# Patient Record
Sex: Female | Born: 1985
Health system: Southern US, Community
[De-identification: ages and names within clinical notes are randomized; demographics above are authoritative.]

## PROBLEM LIST (undated history)

## (undated) DIAGNOSIS — O24419 Gestational diabetes mellitus in pregnancy, unspecified control: Secondary | ICD-10-CM

## (undated) DIAGNOSIS — O139 Gestational [pregnancy-induced] hypertension without significant proteinuria, unspecified trimester: Secondary | ICD-10-CM

## (undated) DIAGNOSIS — K8020 Calculus of gallbladder without cholecystitis without obstruction: Secondary | ICD-10-CM

## (undated) DIAGNOSIS — T8859XA Other complications of anesthesia, initial encounter: Secondary | ICD-10-CM

## (undated) DIAGNOSIS — B009 Herpesviral infection, unspecified: Secondary | ICD-10-CM

## (undated) HISTORY — PX: TONSILLECTOMY: SUR1361

---

## 2012-12-09 DIAGNOSIS — A6000 Herpesviral infection of urogenital system, unspecified: Secondary | ICD-10-CM | POA: Insufficient documentation

## 2015-01-30 DIAGNOSIS — G43909 Migraine, unspecified, not intractable, without status migrainosus: Secondary | ICD-10-CM | POA: Insufficient documentation

## 2018-06-09 ENCOUNTER — Emergency Department (HOSPITAL_COMMUNITY)
Admission: EM | Admit: 2018-06-09 | Discharge: 2018-06-09 | Disposition: A | Discharge status: Home / Self Care | Payer: BC Managed Care – PPO | Attending: Emergency Medicine | Admitting: Emergency Medicine

## 2018-06-09 ENCOUNTER — Encounter (HOSPITAL_COMMUNITY): Payer: Self-pay | Admitting: *Deleted

## 2018-06-09 ENCOUNTER — Other Ambulatory Visit: Payer: Self-pay

## 2018-06-09 DIAGNOSIS — M545 Low back pain, unspecified: Secondary | ICD-10-CM

## 2018-06-09 DIAGNOSIS — B9689 Other specified bacterial agents as the cause of diseases classified elsewhere: Secondary | ICD-10-CM | POA: Insufficient documentation

## 2018-06-09 DIAGNOSIS — N760 Acute vaginitis: Secondary | ICD-10-CM | POA: Diagnosis not present

## 2018-06-09 DIAGNOSIS — K5900 Constipation, unspecified: Secondary | ICD-10-CM | POA: Insufficient documentation

## 2018-06-09 DIAGNOSIS — R110 Nausea: Secondary | ICD-10-CM

## 2018-06-09 DIAGNOSIS — R1030 Lower abdominal pain, unspecified: Secondary | ICD-10-CM | POA: Diagnosis present

## 2018-06-09 LAB — PREGNANCY, URINE: Preg Test, Ur: NEGATIVE

## 2018-06-09 LAB — WET PREP, GENITAL
Sperm: NONE SEEN
Trich, Wet Prep: NONE SEEN
Yeast Wet Prep HPF POC: NONE SEEN

## 2018-06-09 LAB — CBC
HCT: 38.5 % (ref 36.0–46.0)
Hemoglobin: 11.3 g/dL — ABNORMAL LOW (ref 12.0–15.0)
MCH: 24.4 pg — ABNORMAL LOW (ref 26.0–34.0)
MCHC: 29.4 g/dL — ABNORMAL LOW (ref 30.0–36.0)
MCV: 83.2 fL (ref 80.0–100.0)
Platelets: 233 10*3/uL (ref 150–400)
RBC: 4.63 MIL/uL (ref 3.87–5.11)
RDW: 16.4 % — ABNORMAL HIGH (ref 11.5–15.5)
WBC: 7.6 10*3/uL (ref 4.0–10.5)
nRBC: 0 % (ref 0.0–0.2)

## 2018-06-09 LAB — URINALYSIS, ROUTINE W REFLEX MICROSCOPIC
Bilirubin Urine: NEGATIVE
Glucose, UA: NEGATIVE mg/dL
Hgb urine dipstick: NEGATIVE
Ketones, ur: NEGATIVE mg/dL
Leukocytes,Ua: NEGATIVE
Nitrite: NEGATIVE
Protein, ur: NEGATIVE mg/dL
Specific Gravity, Urine: 1.024 (ref 1.005–1.030)
pH: 5 (ref 5.0–8.0)

## 2018-06-09 LAB — COMPREHENSIVE METABOLIC PANEL
ALT: 11 U/L (ref 0–44)
AST: 13 U/L — ABNORMAL LOW (ref 15–41)
Albumin: 3.5 g/dL (ref 3.5–5.0)
Alkaline Phosphatase: 64 U/L (ref 38–126)
Anion gap: 5 (ref 5–15)
BUN: 12 mg/dL (ref 6–20)
CO2: 26 mmol/L (ref 22–32)
Calcium: 8.4 mg/dL — ABNORMAL LOW (ref 8.9–10.3)
Chloride: 106 mmol/L (ref 98–111)
Creatinine, Ser: 0.8 mg/dL (ref 0.44–1.00)
GFR calc Af Amer: >60 mL/min (ref >60)
GFR calc non Af Amer: >60 mL/min (ref >60)
Glucose, Bld: 107 mg/dL — ABNORMAL HIGH (ref 70–99)
Potassium: 3.8 mmol/L (ref 3.5–5.1)
Sodium: 137 mmol/L (ref 135–145)
Total Bilirubin: 0.6 mg/dL (ref 0.3–1.2)
Total Protein: 7.2 g/dL (ref 6.5–8.1)

## 2018-06-09 LAB — LIPASE, BLOOD: Lipase: 22 U/L (ref 11–51)

## 2018-06-09 MED ORDER — METRONIDAZOLE 500 MG PO TABS: 500.0000 mg | ORAL_TABLET | Freq: Two times a day (BID) | ORAL | 0 refills | Status: AC

## 2018-06-09 MED ORDER — FAMOTIDINE 20 MG PO TABS: 20.0000 mg | ORAL_TABLET | Freq: Every day | ORAL | 0 refills | Status: DC

## 2018-06-09 MED ORDER — POLYETHYLENE GLYCOL 3350 17 G PO PACK: 17.0000 g | PACK | Freq: Every day | ORAL | 0 refills | Status: DC

## 2018-06-09 NOTE — Discharge Instructions (Addendum)
Take Flagyl as prescribed.  Do not drink alcohol or taking this medication, as it can cause liver issues. Take pepcid daily to see if this helps with your nausea. Use MiraLAX daily until you are having regular bowel movements. It is important that you are staying well-hydrated with water.  Your urine should be clear to pale yellow. Use Tylenol and ibuprofen as needed for pain.  You may also try muscle patches/creams such as salonpas, icy hot, BenGay, or Biofreeze. Follow-up with your OB/GYN next week at your scheduled appointment.  You should discuss your pain and possible causes for this. If your symptoms not improving in 1 week with treatment with MiraLAX and famotidine, call the GI doctor listed below for further evaluation of your symptoms. Return to the emergency room if you develop high fevers, severe worsening abdominal pain, persistent, or any new, worsening, or concerning symptoms.

## 2018-06-09 NOTE — ED Provider Notes (Signed)
[facility] EMERGENCY DEPARTMENT Provider Note   CSN: [id number] Arrival date & time: 06/09/18  1329    History   Chief Complaint Chief Complaint  Patient presents with  . Back Pain    HPI Jackie Rojas is a 33 y.o. female presenting for evaluation of low back pain, lower abdominal pain, vaginal discharge, nausea.  Patient states for the past 2 months, she has been having gradually worsening symptoms.  She reports bilateral low back pain which radiates to her hips and her anterior abdomen.  Pain is been worse over the past 5 days.  Patient reports associated nausea.  Pain is worse with movement, and initially was worse with eating, although this is no longer true.  Patient states that since her pain became worse, about 5 days ago, she has had worsened constipation.  Patient states that she always has issues with needing to strain with a bowel movement, but is been worse these past couple days.  Her last bowel movement was today, but it was only little pellets.  She denies fevers, chills, chest pain, shortness of breath, vomiting, or abnormal urination.  Patient reports mild vaginal discharge which is baseline for her.  She is sexually active, but always uses protection.  Patient has an IUD for bleeding control and her periods.  Last period was 2 weeks ago, it was normal for her.    HPI  History reviewed. No pertinent past medical history.  There are no active problems to display for this patient.   Past Surgical History:  Procedure Laterality Date  . TONSILLECTOMY       OB History   No obstetric history on file.      Home Medications    Prior to Admission medications   Medication Sig Start Date End Date Taking? Authorizing Provider  famotidine (PEPCID) 20 MG tablet Take 1 tablet (20 mg total) by mouth daily. 06/09/18   Tyiesha Brackney, PA-C  metroNIDAZOLE (FLAGYL) 500 MG tablet Take 1 tablet (500 mg total) by mouth 2 (two) times daily for 7 days. 06/09/18 06/16/18   Charyl Minervini, PA-C  polyethylene glycol (MIRALAX / GLYCOLAX) packet Take 17 g by mouth daily. 06/09/18   Lakeeta Dobosz, PA-C    Family History No family history on file.  Social History Social History   Tobacco Use  . Smoking status: Never Smoker  . Smokeless tobacco: Never Used  Substance Use Topics  . Alcohol use: Yes    Comment: occasionally   . Drug use: Never     Allergies   Patient has no known allergies.   Review of Systems Review of Systems  Gastrointestinal: Positive for abdominal pain, constipation and nausea.  Genitourinary: Positive for vaginal discharge.  Musculoskeletal: Positive for back pain.  All other systems reviewed and are negative.    Physical Exam Updated Vital Signs BP (!) 132/92 (BP Location: Right Arm)   Pulse 80   Temp 97.9 F (36.6 C) (Oral)   Resp 17   Ht  (1.676 m)   Wt 125.2 kg   LMP 05/14/2018   SpO2 99%   BMI 44.55 kg/m   Physical Exam Vitals signs and nursing note reviewed. Exam conducted with a chaperone present.  Constitutional:      General: She is not in acute distress.    Appearance: She is well-developed.     Comments: Sitting comfortably in the bed in no acute distress  HENT:     Head: Normocephalic and atraumatic.  Eyes:  Conjunctiva/sclera: Conjunctivae normal.     Pupils: Pupils are equal, round, and reactive to light.  Neck:     Musculoskeletal: Normal range of motion and neck supple.  Cardiovascular:     Rate and Rhythm: Normal rate and regular rhythm.  Pulmonary:     Effort: Pulmonary effort is normal. No respiratory distress.     Breath sounds: Normal breath sounds. No wheezing.  Abdominal:     General: There is no distension.     Palpations: Abdomen is soft. There is no mass.     Tenderness: There is no abdominal tenderness. There is no guarding or rebound.     Comments: No tenderness palpation the abdomen.  Soft without rigidity, guarding, distention.  Negative rebound.    Genitourinary:    Exam position: Supine.     Cervix: Discharge present.     Uterus: Normal.      Adnexa: Right adnexa normal and left adnexa normal.     Comments: Thin white discharge noted on exam.  No CMT or adnexal tenderness. Musculoskeletal: Normal range of motion.     Comments: Mild discomfort with palpation of bilateral low back musculature.  No pain over midline spine.  Patient is ambulatory with out difficulty.  Skin:    General: Skin is warm and dry.  Neurological:     Mental Status: She is alert and oriented to person, place, and time.      ED Treatments / Results  Labs (all labs ordered are listed, but only abnormal results are displayed) Labs Reviewed  WET PREP, GENITAL - Abnormal; Notable for the following components:      Result Value   Clue Cells Wet Prep HPF POC PRESENT (*)    WBC, Wet Prep HPF POC FEW (*)    All other components within normal limits  COMPREHENSIVE METABOLIC PANEL - Abnormal; Notable for the following components:   Glucose, Bld 107 (*)    Calcium 8.4 (*)    AST 13 (*)    All other components within normal limits  CBC - Abnormal; Notable for the following components:   Hemoglobin 11.3 (*)    MCH 24.4 (*)    MCHC 29.4 (*)    RDW 16.4 (*)    All other components within normal limits  URINALYSIS, ROUTINE W REFLEX MICROSCOPIC - Abnormal; Notable for the following components:   APPearance HAZY (*)    All other components within normal limits  LIPASE, BLOOD  PREGNANCY, URINE  GC/CHLAMYDIA PROBE AMP ([place]) NOT AT [facility]    EKG None  Radiology No results found.  Procedures Procedures (including critical care time)  Medications Ordered in ED Medications - No data to display   Initial Impression / Assessment and Plan / ED Course  I have reviewed the triage vital signs and the nursing notes.  Pertinent labs & imaging results that were available during my care of the patient were reviewed by me and considered in my medical  decision making (see chart for details).        Pt presenting for evaluation of several month history of low back pain with radiation to her abdomen with associated nausea.  Patient also has associated constipation.  On exam today, patient is afebrile and appears nontoxic.  No abdominal tenderness.  Low suspicion for intra-abdominal infection, perforation, obstruction, or surgical abdomen.  Consider GU causes such as pelvic infection.  Consider constipation as cause.  Exam with thin white discharge, but no sign of PID, TOA, or torsion.  Will obtain labs, urine and reassess.  Labs reassuring, no leukocytosis.  Kidney, liver, pancreatic function reassuring.  Patient with mild anemia, this is improved from previous.  Urine without infection.  Wet prep positive for clue cells.  While I doubt BV as the cause of her symptoms, will treat as she is having symptoms.  Discussed findings and plan with patient.  Discussed treatment for constipation with MiraLAX and improved hydration.  Discussed course of Pepcid for persistent nausea.  Encourage patient to follow-up with GI if symptoms are not improving.  Patient already has an appoint with her OB/GYN next week, will follow-up to see if IUD could be causing her pain.  At this time, patient appears safe for discharge.  Return precautions given.  Patient states she understands and agrees to plan.   Final Clinical Impressions(s) / ED Diagnoses   Final diagnoses:  BV (bacterial vaginosis)  Acute bilateral low back pain without sciatica  Nausea  Constipation, unspecified constipation type    ED Discharge Orders         Ordered    famotidine (PEPCID) 20 MG tablet  Daily     06/09/18 1604    polyethylene glycol (MIRALAX / GLYCOLAX) packet  Daily     06/09/18 1604    metroNIDAZOLE (FLAGYL) 500 MG tablet  2 times daily     06/09/18 1604           Emmerich Cryer, PA-C 06/09/18 1619    Terrilee Files, MD 06/10/18 1045

## 2018-06-09 NOTE — ED Triage Notes (Signed)
Pt c/o lower back pain that radiates around both sides into lower abdomen and nausea x months, worsening over the past 4-5 days. Denies dysuria. Pt reports [facility] vaginal discharge but reports it is no different than normal. Pt reports hx of several vaginal infections.

## 2018-06-12 LAB — GC/CHLAMYDIA PROBE AMP (~~LOC~~) NOT AT ARMC
Chlamydia: NEGATIVE
Neisseria Gonorrhea: NEGATIVE

## 2019-01-30 ENCOUNTER — Telehealth: Payer: BC Managed Care – PPO

## 2019-01-30 ENCOUNTER — Ambulatory Visit (INDEPENDENT_AMBULATORY_CARE_PROVIDER_SITE_OTHER)
Admission: RE | Admit: 2019-01-30 | Discharge: 2019-01-30 | Disposition: A | Discharge status: Home / Self Care | Payer: BC Managed Care – PPO | Source: Ambulatory Visit

## 2019-01-30 ENCOUNTER — Other Ambulatory Visit: Payer: Self-pay

## 2019-01-30 DIAGNOSIS — J0100 Acute maxillary sinusitis, unspecified: Secondary | ICD-10-CM

## 2019-01-30 MED ORDER — FLUCONAZOLE 150 MG PO TABS: 150.0000 mg | ORAL_TABLET | Freq: Every day | ORAL | 0 refills | Status: DC

## 2019-01-30 MED ORDER — AMOXICILLIN-POT CLAVULANATE 875-125 MG PO TABS: 1.0000 | ORAL_TABLET | Freq: Two times a day (BID) | ORAL | 0 refills | Status: AC

## 2019-01-30 NOTE — ED Provider Notes (Signed)
Virtual Visit via Video Note:  Jackie Rojas  initiated request for Telemedicine visit with [facility] Urgent [facility] team. I connected with Jackie Rojas  on 01/30/2019 at 4:15 PM  for a synchronized telemedicine visit using a video enabled HIPPA compliant telemedicine application. I verified that I am speaking with Jackie Rojas  using two identifiers. Jackie Balloon, NP  was physically located in a [facility] Urgent care site and Ligaya Cormier was located at a different location.   The limitations of evaluation and management by telemedicine as well as the availability of in-person appointments were discussed. Patient was informed that she  may incur a bill ( including co-pay) for this virtual visit encounter. Jackie Rojas  expressed understanding and gave verbal consent to proceed with virtual visit.     History of Present Illness:Jackie Rojas  is a 33 y.o. female presents for evaluation of sinus pressure and right maxillary sinus tenderness x 6 days.  She states the pain has progressed now to causing her right ear to ache.  She denies fever, chills, sore throat, cough, shortness of breath, or other symptoms.  Treatment attempted at home with Vicks sinus and cold which has not helped.   LMP: 01/21/2019.    No Known Allergies   History reviewed. No pertinent past medical history.   Social History   Tobacco Use  . Smoking status: Never Smoker  . Smokeless tobacco: Never Used  Substance Use Topics  . Alcohol use: Yes    Comment: occasionally   . Drug use: Never        Observations/Objective: Physical Exam  VITALS: Patient denies fever. GENERAL: Alert, appears well and in no acute distress. HEENT: Atraumatic. NECK: Normal movements of the head and neck. CARDIOPULMONARY: No increased WOB. Speaking in clear sentences. I:E ratio WNL.  MS: Moves all visible extremities without noticeable abnormality. PSYCH: Pleasant and cooperative, well-groomed. Speech normal rate and  rhythm. Affect is appropriate. Insight and judgement are appropriate. Attention is focused, linear, and appropriate.  NEURO: CN grossly intact. Oriented as arrived to appointment on time with no prompting. Moves both UE equally.   Assessment and Plan:    ICD-10-CM   1. Acute non-recurrent maxillary sinusitis  J01.00        Follow Up Instructions: Treating with Augmentin and Diflucan (patient states she gets yeast infections when she takes an antibiotic).  Instructed patient to OTC Mucinex.  Discussed that she should be seen here in person or by her PCP if her symptoms or not improving.  Patient agrees to plan of care.    I discussed the assessment and treatment plan with the patient. The patient was provided an opportunity to ask questions and all were answered. The patient agreed with the plan and demonstrated an understanding of the instructions.   The patient was advised to call back or seek an in-person evaluation if the symptoms worsen or if the condition fails to improve as anticipated.      Jackie Balloon, NP  01/30/2019 4:15 PM         Jackie Balloon, NP 01/30/19 1616

## 2019-01-30 NOTE — Discharge Instructions (Addendum)
Take the Augmentin and Diflucan as directed.    Follow up with your primary care provider or come here to be seen in person if your symptoms are not improving.

## 2019-02-04 HISTORY — PX: CHOLECYSTECTOMY: SHX55

## 2019-06-25 ENCOUNTER — Other Ambulatory Visit: Payer: Self-pay

## 2019-06-25 ENCOUNTER — Ambulatory Visit: Payer: BC Managed Care – PPO | Attending: Internal Medicine

## 2019-06-25 DIAGNOSIS — Z20822 Contact with and (suspected) exposure to covid-19: Secondary | ICD-10-CM

## 2019-06-26 ENCOUNTER — Telehealth: Payer: Self-pay

## 2019-06-26 LAB — NOVEL CORONAVIRUS, NAA: SARS-CoV-2, NAA: NOT DETECTED

## 2019-06-26 LAB — SARS-COV-2, NAA 2 DAY TAT

## 2019-06-26 NOTE — Telephone Encounter (Signed)
Patient called and she was informed that her test for COVID-19 06/25/19 was still pending result.  She verbalized understanding and will call back.

## 2019-07-26 ENCOUNTER — Encounter (HOSPITAL_COMMUNITY): Payer: Self-pay

## 2019-07-26 ENCOUNTER — Emergency Department (HOSPITAL_COMMUNITY)
Admission: EM | Admit: 2019-07-26 | Discharge: 2019-07-26 | Disposition: A | Discharge status: Home / Self Care | Payer: BC Managed Care – PPO | Attending: Emergency Medicine | Admitting: Emergency Medicine

## 2019-07-26 ENCOUNTER — Other Ambulatory Visit: Payer: Self-pay

## 2019-07-26 DIAGNOSIS — R55 Syncope and collapse: Secondary | ICD-10-CM

## 2019-07-26 DIAGNOSIS — Z79899 Other long term (current) drug therapy: Secondary | ICD-10-CM | POA: Insufficient documentation

## 2019-07-26 DIAGNOSIS — R519 Headache, unspecified: Secondary | ICD-10-CM

## 2019-07-26 HISTORY — DX: Gestational (pregnancy-induced) hypertension without significant proteinuria, unspecified trimester: O13.9

## 2019-07-26 LAB — URINALYSIS, ROUTINE W REFLEX MICROSCOPIC
Bilirubin Urine: NEGATIVE
Glucose, UA: NEGATIVE mg/dL
Hgb urine dipstick: NEGATIVE
Ketones, ur: NEGATIVE mg/dL
Leukocytes,Ua: NEGATIVE
Nitrite: NEGATIVE
Protein, ur: NEGATIVE mg/dL
Specific Gravity, Urine: 1.027 (ref 1.005–1.030)
pH: 6 (ref 5.0–8.0)

## 2019-07-26 LAB — BASIC METABOLIC PANEL
Anion gap: 8 (ref 5–15)
BUN: 14 mg/dL (ref 6–20)
CO2: 25 mmol/L (ref 22–32)
Calcium: 8.4 mg/dL — ABNORMAL LOW (ref 8.9–10.3)
Chloride: 103 mmol/L (ref 98–111)
Creatinine, Ser: 0.83 mg/dL (ref 0.44–1.00)
GFR calc Af Amer: >60 mL/min (ref >60)
GFR calc non Af Amer: >60 mL/min (ref >60)
Glucose, Bld: 101 mg/dL — ABNORMAL HIGH (ref 70–99)
Potassium: 4.1 mmol/L (ref 3.5–5.1)
Sodium: 136 mmol/L (ref 135–145)

## 2019-07-26 LAB — CBC WITH DIFFERENTIAL/PLATELET
Abs Immature Granulocytes: 0.02 10*3/uL (ref 0.00–0.07)
Basophils Absolute: 0 10*3/uL (ref 0.0–0.1)
Basophils Relative: 0 %
Eosinophils Absolute: 0 10*3/uL (ref 0.0–0.5)
Eosinophils Relative: 0 %
HCT: 37.7 % (ref 36.0–46.0)
Hemoglobin: 11.4 g/dL — ABNORMAL LOW (ref 12.0–15.0)
Immature Granulocytes: 0 %
Lymphocytes Relative: 33 %
Lymphs Abs: 2.5 10*3/uL (ref 0.7–4.0)
MCH: 25.2 pg — ABNORMAL LOW (ref 26.0–34.0)
MCHC: 30.2 g/dL (ref 30.0–36.0)
MCV: 83.4 fL (ref 80.0–100.0)
Monocytes Absolute: 0.6 10*3/uL (ref 0.1–1.0)
Monocytes Relative: 8 %
Neutro Abs: 4.5 10*3/uL (ref 1.7–7.7)
Neutrophils Relative %: 59 %
Platelets: 281 10*3/uL (ref 150–400)
RBC: 4.52 MIL/uL (ref 3.87–5.11)
RDW: 15.9 % — ABNORMAL HIGH (ref 11.5–15.5)
WBC: 7.6 10*3/uL (ref 4.0–10.5)
nRBC: 0 % (ref 0.0–0.2)

## 2019-07-26 LAB — TROPONIN I (HIGH SENSITIVITY)
Troponin I (High Sensitivity): <2 ng/L (ref <18)
Troponin I (High Sensitivity): <2 ng/L (ref <18)

## 2019-07-26 LAB — PREGNANCY, URINE: Preg Test, Ur: NEGATIVE

## 2019-07-26 MED ORDER — ACETAMINOPHEN 325 MG PO TABS
650.0000 mg | ORAL_TABLET | Freq: Once | ORAL | Status: AC
  Administered 2019-07-26: 650 mg via ORAL
  Filled 2019-07-26: qty 2

## 2019-07-26 NOTE — Discharge Instructions (Signed)
As discussed, follow-up with your primary doctor for recheck.  Return to ER for any worsening symptoms

## 2019-07-26 NOTE — ED Triage Notes (Signed)
Pt presents with complaints of headache, nausea, dizziness and acid reflux.   Reports BP at work systolic was 205/  Pt reports Excedrin migraine PTA and no longer has headache.

## 2019-07-26 NOTE — ED Provider Notes (Signed)
[facility] EMERGENCY DEPARTMENT Provider Note   CSN: [id number] Arrival date & time: 07/26/19  1219     History Chief Complaint  Patient presents with  . Headache    Jackie Rojas is a 34 y.o. female.  HPI      Jackie Rojas is a 34 y.o. female who presents to the Emergency Department for evaluation of possible hypertension.  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  She reports intermittent headaches for weeks to months.  She describes the headaches as throbbing pounding sensations from her forehead to the top of her head.  Headaches typically improve after over-the-counter pain relievers.  She had a headache earlier this morning that was gradual in onset and similar to previous headaches and improved after Excedrin migraine. 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  She states that she had a systolic blood pressure greater than 200 and was recommended by co workers that she needed medical evaluation. No actual syncope, visual change, neck pain or stiffness, fever or chills, numbness or weakness of face or extremities.  She denies recent illness.  She also states that 2 weeks ago she took a morning-after pill and had a irregular menstrual cycle shortly after.  Concerned she may be pregnant,  She denies any pelvic or abdominal pain, no vaginal bleeding at present.  She admits to a significant family history for diabetes but has never been diagnosed herself.   Past Medical History:  Diagnosis Date  . Gestational hypertension     There are no problems to display for this patient.   Past Surgical History:  Procedure Laterality Date  . TONSILLECTOMY       OB History   No obstetric history on file.     History reviewed. No pertinent family history.  Social History   Tobacco Use  . Smoking status: Never  Smoker  . Smokeless tobacco: Never Used  Substance Use Topics  . Alcohol use: Yes    Comment: occasionally   . Drug use: Never    Home Medications Prior to Admission medications   Medication Sig Start Date End Date Taking? Authorizing Provider  aspirin-acetaminophen-caffeine (EXCEDRIN MIGRAINE) [phone] MG tablet Take 2 tablets by mouth every 6 (six) hours as needed.    Yes [provider]  fluticasone (FLONASE) 50 MCG/ACT nasal spray Place 1 spray into both nostrils daily as needed for allergies or rhinitis.   Yes [provider]  ibuprofen (ADVIL) 200 MG tablet Take 800 mg by mouth every 6 (six) hours as needed.   Yes [provider]  SUMAtriptan (IMITREX) 100 MG tablet Take 1 tablet by mouth 2 (two) times daily as needed. 12/30/16  Yes [provider]  valACYclovir (VALTREX) 500 MG tablet Take 2,000 mg by mouth daily as needed.   Yes [provider]    Allergies    Patient has no known allergies.  Review of Systems   Review of Systems  Constitutional: Negative for appetite change, chills, fatigue and fever.  HENT: Negative for rhinorrhea, sinus pressure, sinus pain and trouble swallowing.   Eyes: Negative for visual disturbance.  Respiratory: Negative for cough, chest tightness and shortness of breath.   Cardiovascular: Negative for chest pain and palpitations.  Gastrointestinal: Positive for nausea. Negative for abdominal pain, blood in stool and vomiting.  Genitourinary: Positive  for menstrual problem. Negative for dysuria, flank pain, hematuria and vaginal discharge.  Musculoskeletal: Negative for arthralgias, back pain, myalgias, neck pain and neck stiffness.  Skin: Negative for rash.  Neurological: Positive for syncope (near syncope) and headaches. Negative for dizziness, facial asymmetry, speech difficulty, weakness and numbness.  Hematological: Does not bruise/bleed easily.  Psychiatric/Behavioral: Negative for confusion.     Physical Exam Updated Vital Signs BP 127/85   Pulse 81   Temp 98 F (36.7 C) (Oral)   Resp 11   Ht 5\' 6"  (1.676 m)   Wt 127 kg   LMP 07/18/2019   SpO2 100%   BMI 45.19 kg/m   Physical Exam Vitals and nursing note reviewed.  Constitutional:      General: She is not in acute distress.    Appearance: She is well-developed. She is obese. She is not ill-appearing.  HENT:     Head: Normocephalic.     Mouth/Throat:     Mouth: Mucous membranes are moist.  Eyes:     Extraocular Movements: Extraocular movements intact.     Right eye: Normal extraocular motion and no nystagmus.     Left eye: Normal extraocular motion and no nystagmus.     Pupils: Pupils are equal, round, and reactive to light.  Neck:     Meningeal: Kernig's sign absent.  Cardiovascular:     Rate and Rhythm: Normal rate and regular rhythm.  Pulmonary:     Effort: Pulmonary effort is normal.     Breath sounds: Normal breath sounds.  Abdominal:     Palpations: Abdomen is soft.     Tenderness: There is no abdominal tenderness.  Musculoskeletal:        General: No swelling. Normal range of motion.     Cervical back: Normal range of motion. No rigidity.  Skin:    General: Skin is warm.     Capillary Refill: Capillary refill takes less than 2 seconds.     Findings: No rash.  Neurological:     Mental Status: She is alert.     GCS: GCS eye subscore is 4. GCS verbal subscore is 5. GCS motor subscore is 6.     Sensory: Sensation is intact.     Motor: Motor function is intact. No weakness.     Coordination: Coordination is intact.     Gait: Gait normal.     Comments: CN II-XII intact.  Speech clear.  No pronator drift.  nml finger nose testing. No antalgic gait.   Psychiatric:        Mood and Affect: Mood normal.     ED Results / Procedures / Treatments   Labs (all labs ordered are listed, but only abnormal results are displayed) Labs Reviewed  BASIC METABOLIC PANEL - Abnormal; Notable for the following  components:      Result Value   Glucose, Bld 101 (*)    Calcium 8.4 (*)    All other components within normal limits  CBC WITH DIFFERENTIAL/PLATELET - Abnormal; Notable for the following components:   Hemoglobin 11.4 (*)    MCH 25.2 (*)    RDW 15.9 (*)    All other components within normal limits  URINALYSIS, ROUTINE W REFLEX MICROSCOPIC  PREGNANCY, URINE  TROPONIN I (HIGH SENSITIVITY)  TROPONIN I (HIGH SENSITIVITY)    EKG EKG Interpretation  Date/Time:  Thursday July 26 2019 12:54:14 EDT Ventricular Rate:  67 PR Interval:    QRS Duration: 90 QT Interval:  399 QTC Calculation: 422 R  Axis:   38 Text Interpretation: Sinus rhythm Low voltage, precordial leads Abnormal R-wave progression, early transition Borderline T abnormalities, inferior leads Baseline wander in lead(s) I III aVL V6 No STEMI Confirmed by Alona Bene [phone]) on 07/26/2019 1:06:26 PM   Radiology No results found.  Procedures Procedures (including critical care time)  Medications Ordered in ED Medications  acetaminophen (TYLENOL) tablet 650 mg (650 mg Oral Given 07/26/19 1442)    ED Course  I have reviewed the triage vital signs and the nursing notes.  Pertinent labs & imaging results that were available during my care of the patient were reviewed by me and considered in my medical decision making (see chart for details).    MDM Rules/Calculators/A&P                      Orthostatic VS for the past 24 hrs (Last 3 readings):  BP- Lying Pulse- Lying BP- Sitting Pulse- Sitting BP- Standing at 0 minutes Pulse- Standing at 0 minutes  07/26/19 1345 112/49 59 154/80 64 110/67 74    Pt appears well and non-toxic.  She is not hypertensive here.  Headache resolved prior to arrival.  No focal neuro deficits, visual changes.  No nuchal rigidity.  Headache was gradual in onset and had resolved prior to near syncopal episode. No indication for emergent CT imaging of head.  Episode occurred after exertion.  No  longer feeling faint.   No chest pain or dyspnea.  PERC neg.    Work up reassuring.  Pt reports feeling better after evaluation, has tolerated po fluids and ate a snack.  I feel she is appropriate for d/c home, return precautions discussed.    Final Clinical Impression(s) / ED Diagnoses Final diagnoses:  Near syncope  Headache disorder    Rx / DC Orders ED Discharge Orders    None       Rosey Bath 07/27/19 2010    Maia Plan, MD 07/30/19 1358

## 2019-08-28 ENCOUNTER — Inpatient Hospital Stay (HOSPITAL_COMMUNITY): Payer: BC Managed Care – PPO

## 2019-08-28 ENCOUNTER — Other Ambulatory Visit: Payer: Self-pay

## 2019-08-28 ENCOUNTER — Encounter (HOSPITAL_COMMUNITY): Payer: Self-pay | Admitting: Obstetrics and Gynecology

## 2019-08-28 ENCOUNTER — Inpatient Hospital Stay (HOSPITAL_COMMUNITY)
Admission: AD | Admit: 2019-08-28 | Discharge: 2019-08-28 | Disposition: A | Discharge status: Home / Self Care | Payer: BC Managed Care – PPO | Attending: Obstetrics and Gynecology | Admitting: Obstetrics and Gynecology

## 2019-08-28 DIAGNOSIS — O99891 Other specified diseases and conditions complicating pregnancy: Secondary | ICD-10-CM | POA: Diagnosis not present

## 2019-08-28 DIAGNOSIS — R109 Unspecified abdominal pain: Secondary | ICD-10-CM | POA: Insufficient documentation

## 2019-08-28 DIAGNOSIS — M545 Low back pain: Secondary | ICD-10-CM | POA: Insufficient documentation

## 2019-08-28 DIAGNOSIS — Z3A01 Less than 8 weeks gestation of pregnancy: Secondary | ICD-10-CM

## 2019-08-28 DIAGNOSIS — O26899 Other specified pregnancy related conditions, unspecified trimester: Secondary | ICD-10-CM

## 2019-08-28 DIAGNOSIS — O3680X0 Pregnancy with inconclusive fetal viability, not applicable or unspecified: Secondary | ICD-10-CM | POA: Diagnosis not present

## 2019-08-28 DIAGNOSIS — O26891 Other specified pregnancy related conditions, first trimester: Secondary | ICD-10-CM | POA: Diagnosis not present

## 2019-08-28 HISTORY — DX: Herpesviral infection, unspecified: B00.9

## 2019-08-28 HISTORY — DX: Calculus of gallbladder without cholecystitis without obstruction: K80.20

## 2019-08-28 LAB — CBC
HCT: 39.2 % (ref 36.0–46.0)
Hemoglobin: 12.1 g/dL (ref 12.0–15.0)
MCH: 25.4 pg — ABNORMAL LOW (ref 26.0–34.0)
MCHC: 30.9 g/dL (ref 30.0–36.0)
MCV: 82.2 fL (ref 80.0–100.0)
Platelets: 271 10*3/uL (ref 150–400)
RBC: 4.77 MIL/uL (ref 3.87–5.11)
RDW: 16.5 % — ABNORMAL HIGH (ref 11.5–15.5)
WBC: 7.1 10*3/uL (ref 4.0–10.5)
nRBC: 0 % (ref 0.0–0.2)

## 2019-08-28 LAB — COMPREHENSIVE METABOLIC PANEL
ALT: 12 U/L (ref 0–44)
AST: 13 U/L — ABNORMAL LOW (ref 15–41)
Albumin: 3.3 g/dL — ABNORMAL LOW (ref 3.5–5.0)
Alkaline Phosphatase: 59 U/L (ref 38–126)
Anion gap: 10 (ref 5–15)
BUN: 10 mg/dL (ref 6–20)
CO2: 23 mmol/L (ref 22–32)
Calcium: 8.9 mg/dL (ref 8.9–10.3)
Chloride: 104 mmol/L (ref 98–111)
Creatinine, Ser: 0.81 mg/dL (ref 0.44–1.00)
GFR calc Af Amer: >60 mL/min (ref >60)
GFR calc non Af Amer: >60 mL/min (ref >60)
Glucose, Bld: 99 mg/dL (ref 70–99)
Potassium: 4.3 mmol/L (ref 3.5–5.1)
Sodium: 137 mmol/L (ref 135–145)
Total Bilirubin: 0.5 mg/dL (ref 0.3–1.2)
Total Protein: 7.4 g/dL (ref 6.5–8.1)

## 2019-08-28 LAB — URINALYSIS, ROUTINE W REFLEX MICROSCOPIC
Bilirubin Urine: NEGATIVE
Glucose, UA: NEGATIVE mg/dL
Hgb urine dipstick: NEGATIVE
Ketones, ur: NEGATIVE mg/dL
Leukocytes,Ua: NEGATIVE
Nitrite: NEGATIVE
Protein, ur: NEGATIVE mg/dL
Specific Gravity, Urine: 1.024 (ref 1.005–1.030)
pH: 6 (ref 5.0–8.0)

## 2019-08-28 LAB — ABO/RH: ABO/RH(D): O POS

## 2019-08-28 LAB — WET PREP, GENITAL
Clue Cells Wet Prep HPF POC: NONE SEEN
Sperm: NONE SEEN
Trich, Wet Prep: NONE SEEN
Yeast Wet Prep HPF POC: NONE SEEN

## 2019-08-28 LAB — HCG, QUANTITATIVE, PREGNANCY: hCG, Beta Chain, Quant, S: 54 m[IU]/mL — ABNORMAL HIGH (ref <5)

## 2019-08-28 LAB — POCT PREGNANCY, URINE: Preg Test, Ur: POSITIVE — AB

## 2019-08-28 NOTE — MAU Note (Signed)
Cycle was late, been cramping really bad. +HPT.  Still cramping really bad, has been having a ring of pressure in lower back. No bleeding.

## 2019-08-28 NOTE — Discharge Instructions (Signed)
Abdominal Pain During Pregnancy  Belly (abdominal) pain is common during pregnancy. There are many possible causes. Most of the time, it is not a serious problem. Other times, it can be a sign that something is wrong with the pregnancy. Always tell your doctor if you have belly pain. Follow these instructions at home:  Do not have sex or put anything in your vagina until your pain goes away completely.  Get plenty of rest until your pain gets better.  Drink enough fluid to keep your pee (urine) pale yellow.  Take over-the-counter and prescription medicines only as told by your doctor.  Keep all follow-up visits as told by your doctor. This is important. Contact a doctor if:  Your pain continues or gets worse after resting.  You have lower belly pain that: ? Comes and goes at regular times. ? Spreads to your back. ? Feels like menstrual cramps.  You have pain or burning when you pee (urinate). Get help right away if:  You have a fever or chills.  You have vaginal bleeding.  You are leaking fluid from your vagina.  You are passing tissue from your vagina.  You throw up (vomit) for more than 24 hours.  You have watery poop (diarrhea) for more than 24 hours.  Your baby is moving less than usual.  You feel very weak or faint.  You have shortness of breath.  You have very bad pain in your upper belly. Summary  Belly (abdominal) pain is common during pregnancy. There are many possible causes.  If you have belly pain during pregnancy, tell your doctor right away.  Keep all follow-up visits as told by your doctor. This is important. This information is not intended to replace advice given to you by your health care provider. Make sure you discuss any questions you have with your health care provider. Document Revised: 07/10/2018 Document Reviewed: 06/24/2016 Elsevier Patient Education  2020 [employer]  

## 2019-08-28 NOTE — MAU Provider Note (Signed)
History     CSN: [id number]  Arrival date and time: 08/28/19 6948   First Provider Initiated Contact with Patient 08/28/19 1204      Chief Complaint  Patient presents with  . Abdominal Pain  . Back Pain  . Possible Pregnancy   HPI Ineze Rojas is a 34 y.o. [phone] at [redacted]w[redacted]d by "very uncertain" LMP who presents to [facility] with chief complaints of abdominal and low back pain in the setting of faint positive home pregnancy test. She rates her pain as 6/10 and "crampy". She denies aggravating or alleviating factors. She has not taken medication or tried other treatments for this complaint. She denies abdominal tenderness, dysuria, vaginal bleeding, fever or recent illness.  OB History    Gravida  5   Para  3   Term  3   Preterm      AB  1   Living  3     SAB      TAB      Ectopic      Multiple      Live Births              Past Medical History:  Diagnosis Date  . Gallstones   . Gestational hypertension    PP  . HSV-2 infection     Past Surgical History:  Procedure Laterality Date  . TONSILLECTOMY      History reviewed. No pertinent family history.  Social History   Tobacco Use  . Smoking status: Never Smoker  . Smokeless tobacco: Never Used  Substance Use Topics  . Alcohol use: Not Currently    Comment: occasionally   . Drug use: Never    Allergies: No Known Allergies  No medications prior to admission.    Review of Systems  Gastrointestinal: Positive for abdominal pain.  Genitourinary: Negative for dysuria, flank pain, vaginal bleeding, vaginal discharge and vaginal pain.  Musculoskeletal: Positive for back pain.  All other systems reviewed and are negative.  Physical Exam   Blood pressure 129/82, pulse 80, temperature 98.6 F (37 C), temperature source Oral, resp. rate 18, height 5\' 6"  (1.676 m), weight 130.4 kg, last menstrual period 07/17/2019, SpO2 100 %.  Physical Exam  Nursing note and vitals reviewed. Constitutional: She is  oriented to person, place, and time. She appears well-developed and well-nourished.  Cardiovascular: Normal rate and normal heart sounds.  Respiratory: Effort normal and breath sounds normal. She has no decreased breath sounds.  GI: Soft. Bowel sounds are normal. She exhibits no distension. There is no abdominal tenderness. There is no rebound and no CVA tenderness.  Genitourinary:    Genitourinary Comments: Blind swab. No abnormal discharge noted   Musculoskeletal:        General: Normal range of motion.  Neurological: She is alert and oriented to person, place, and time.  Skin: Skin is warm and dry.  Psychiatric: She has a normal mood and affect. Judgment and thought content normal.    MAU Course/MDM  Procedures  Patient Vitals for the past 24 hrs:  BP Temp Temp src Pulse Resp SpO2 Height Weight  08/28/19 1223 129/82 -- -- -- -- -- -- --  08/28/19 0933 131/86 98.6 F (37 C) Oral 80 18 100 % 5\' 6"  (1.676 m) 130.4 kg   Results for orders placed or performed during the hospital encounter of 08/28/19 (from the past 24 hour(s))  Pregnancy, urine POC     Status: Abnormal   Collection Time: 08/28/19  9:44 AM  Result Value Ref Range   Preg Test, Ur POSITIVE (A) NEGATIVE  Urinalysis, Routine w reflex microscopic     Status: None   Collection Time: 08/28/19  9:45 AM  Result Value Ref Range   Color, Urine YELLOW YELLOW   APPearance CLEAR CLEAR   Specific Gravity, Urine 1.024 1.005 - 1.030   pH 6.0 5.0 - 8.0   Glucose, UA NEGATIVE NEGATIVE mg/dL   Hgb urine dipstick NEGATIVE NEGATIVE   Bilirubin Urine NEGATIVE NEGATIVE   Ketones, ur NEGATIVE NEGATIVE mg/dL   Protein, ur NEGATIVE NEGATIVE mg/dL   Nitrite NEGATIVE NEGATIVE   Leukocytes,Ua NEGATIVE NEGATIVE  CBC     Status: Abnormal   Collection Time: 08/28/19 10:01 AM  Result Value Ref Range   WBC 7.1 4.0 - 10.5 K/uL   RBC 4.77 3.87 - 5.11 MIL/uL   Hemoglobin 12.1 12.0 - 15.0 g/dL   HCT 16.9 67.8 - 93.8 %   MCV 82.2 80.0 - 100.0  fL   MCH 25.4 (L) 26.0 - 34.0 pg   MCHC 30.9 30.0 - 36.0 g/dL   RDW 10.1 (H) 75.1 - 02.5 %   Platelets 271 150 - 400 K/uL   nRBC 0.0 0.0 - 0.2 %  Comprehensive metabolic panel     Status: Abnormal   Collection Time: 08/28/19 10:01 AM  Result Value Ref Range   Sodium 137 135 - 145 mmol/L   Potassium 4.3 3.5 - 5.1 mmol/L   Chloride 104 98 - 111 mmol/L   CO2 23 22 - 32 mmol/L   Glucose, Bld 99 70 - 99 mg/dL   BUN 10 6 - 20 mg/dL   Creatinine, Ser 8.52 0.44 - 1.00 mg/dL   Calcium 8.9 8.9 - 77.8 mg/dL   Total Protein 7.4 6.5 - 8.1 g/dL   Albumin 3.3 (L) 3.5 - 5.0 g/dL   AST 13 (L) 15 - 41 U/L   ALT 12 0 - 44 U/L   Alkaline Phosphatase 59 38 - 126 U/L   Total Bilirubin 0.5 0.3 - 1.2 mg/dL   GFR calc non Af Amer >60 >60 mL/min   GFR calc Af Amer >60 >60 mL/min   Anion gap 10 5 - 15  ABO/Rh     Status: None   Collection Time: 08/28/19 10:01 AM  Result Value Ref Range   ABO/RH(D) O POS    No rh immune globuloin      NOT A RH IMMUNE GLOBULIN CANDIDATE, PT RH POSITIVE Performed at [facility], [address] [place], [place] [address]   hCG, quantitative, pregnancy     Status: Abnormal   Collection Time: 08/28/19 10:01 AM  Result Value Ref Range   hCG, Beta Chain, Quant, S 54 (H) <5 mIU/mL  Wet prep, genital     Status: Abnormal   Collection Time: 08/28/19 10:17 AM   Specimen: Vaginal  Result Value Ref Range   Yeast Wet Prep HPF POC NONE SEEN NONE SEEN   Trich, Wet Prep NONE SEEN NONE SEEN   Clue Cells Wet Prep HPF POC NONE SEEN NONE SEEN   WBC, Wet Prep HPF POC MANY (A) NONE SEEN   Sperm NONE SEEN    US OB LESS THAN 14 WEEKS WITH OB TRANSVAGINAL  Result Date: 08/28/2019 CLINICAL DATA:  Abdominal cramping. Patient reportedly pregnant, 6 weeks and 0 days based on her last menstrual period. EXAM: OBSTETRIC <14 WK [place] AND TRANSVAGINAL OB US TECHNIQUE: Both transabdominal and transvaginal ultrasound examinations were performed for  complete evaluation of the gestation as well  as the maternal uterus, adnexal regions, and pelvic cul-de-sac. Transvaginal technique was performed to assess early pregnancy. COMPARISON:  None. FINDINGS: Intrauterine gestational sac: None Yolk sac:  Not Visualized. Embryo:  Not Visualized. Cardiac Activity: Not applicable Maternal uterus/adnexae: No uterine masses. Normal cervix. Endometrium normal in overall thickness, proximally 9 mm. No endometrial fluid. Normal right ovary. Left ovary not well visualized but grossly normal in size. No adnexal masses. No pelvic free fluid. IMPRESSION: 1. Negative exam. 2. No evidence of an intrauterine or ectopic pregnancy. Recommend correlation with the beta HCG level. If this patient is pregnant, follow-up ultrasound in 10-14 days to document normal progression be recommended. Electronically Signed   By: Amie Portland M.D.   On: 08/28/2019 11:49   Assessment and Plan  --34 y.o. [id number] at [redacted]w[redacted]d by uncertain LMP --Pregnancy of unknown location --Quant hCG = 54 --Discharge home in stable condition with ectopic/first trimester precautions  F/U: --Repeat Quant hCG at [facility] 08/30/2019  Calvert Cantor, CNM 08/28/2019, 1:55 PM

## 2019-08-29 LAB — GC/CHLAMYDIA PROBE AMP (~~LOC~~) NOT AT ARMC
Chlamydia: NEGATIVE
Comment: NEGATIVE
Comment: NORMAL
Neisseria Gonorrhea: NEGATIVE

## 2019-08-30 ENCOUNTER — Other Ambulatory Visit: Payer: BC Managed Care – PPO

## 2019-08-30 DIAGNOSIS — O3680X0 Pregnancy with inconclusive fetal viability, not applicable or unspecified: Secondary | ICD-10-CM

## 2019-08-31 LAB — BETA HCG QUANT (REF LAB): hCG Quant: 105 m[IU]/mL

## 2019-09-05 ENCOUNTER — Telehealth: Payer: Self-pay | Admitting: Adult Health

## 2019-09-05 ENCOUNTER — Telehealth: Payer: Self-pay | Admitting: *Deleted

## 2019-09-05 ENCOUNTER — Encounter: Payer: Self-pay | Admitting: *Deleted

## 2019-09-05 NOTE — Telephone Encounter (Signed)
LMOVM returning patient's call.  Will add to lab schedule for bloodwork today and patient can come at her convenience to have it done. Will send mychart message as well.

## 2019-09-05 NOTE — Telephone Encounter (Signed)
Patient wants to schedule next set of labwork that the ER has requested to be done with our office. Patient requesting a follow up call on if she can schedule labs with our office.

## 2019-09-06 ENCOUNTER — Other Ambulatory Visit: Payer: Self-pay

## 2019-09-06 ENCOUNTER — Telehealth: Payer: Self-pay | Admitting: Adult Health

## 2019-09-06 LAB — BETA HCG QUANT (REF LAB): hCG Quant: 2386 m[IU]/mL

## 2019-09-06 LAB — PROGESTERONE: Progesterone: 14.6 ng/mL

## 2019-09-06 NOTE — Telephone Encounter (Signed)
Pt aware of QHCG and that progesterone level is good, will get [place] in about 14 days

## 2019-09-17 ENCOUNTER — Telehealth: Payer: Self-pay | Admitting: Obstetrics and Gynecology

## 2019-09-17 ENCOUNTER — Telehealth: Payer: Self-pay | Admitting: *Deleted

## 2019-09-17 NOTE — Telephone Encounter (Signed)
Pt states she wants to know when she is going to be scheduled for her 'initial OB' appt explained to pt she is on schedule for dating [place] for this month but she states she hasnt established care yet

## 2019-09-17 NOTE — Telephone Encounter (Signed)
Patient states she has not established care in our office and has only had blood work.  Informed patient that once she has her dating u/s we will then get her scheduled for a new ob visit to go over her health history and get her established. Pt verbalized understanding with no further questions.

## 2019-09-18 ENCOUNTER — Telehealth: Payer: Self-pay | Admitting: Adult Health

## 2019-09-18 MED ORDER — DOXYLAMINE-PYRIDOXINE 10-10 MG PO TBEC: DELAYED_RELEASE_TABLET | ORAL | 3 refills | Status: DC

## 2019-09-18 NOTE — Telephone Encounter (Signed)
Patient called stating that she is suffering from Extreme Nausea and she has to go to work tomorrow and is not able to function with the nausea. Pt would like for our provider to call her something in. Please contact pt

## 2019-09-18 NOTE — Telephone Encounter (Signed)
Telephoned patient at home number and patient states having severe nausea. Patient starts summer school tomorrow and would like something for nausea called in. No other symptoms. Advised patient to check with pharmacy if does not receive call back. Patient voiced understanding.

## 2019-09-18 NOTE — Telephone Encounter (Signed)
Will rx diclegis  

## 2019-09-18 NOTE — Addendum Note (Signed)
Addended by: Cyril Mourning A on: 09/18/2019 02:18 PM   Modules accepted: Orders

## 2019-09-19 ENCOUNTER — Inpatient Hospital Stay (HOSPITAL_COMMUNITY)
Admission: AD | Admit: 2019-09-19 | Discharge: 2019-09-19 | Disposition: A | Discharge status: Home / Self Care | Payer: BC Managed Care – PPO | Attending: Obstetrics & Gynecology | Admitting: Obstetrics & Gynecology

## 2019-09-19 ENCOUNTER — Encounter (HOSPITAL_COMMUNITY): Payer: Self-pay | Admitting: Obstetrics & Gynecology

## 2019-09-19 ENCOUNTER — Other Ambulatory Visit: Payer: Self-pay

## 2019-09-19 DIAGNOSIS — O219 Vomiting of pregnancy, unspecified: Secondary | ICD-10-CM

## 2019-09-19 DIAGNOSIS — O99891 Other specified diseases and conditions complicating pregnancy: Secondary | ICD-10-CM | POA: Diagnosis not present

## 2019-09-19 DIAGNOSIS — Z79899 Other long term (current) drug therapy: Secondary | ICD-10-CM | POA: Diagnosis not present

## 2019-09-19 DIAGNOSIS — R1084 Generalized abdominal pain: Secondary | ICD-10-CM | POA: Insufficient documentation

## 2019-09-19 DIAGNOSIS — R140 Abdominal distension (gaseous): Secondary | ICD-10-CM

## 2019-09-19 DIAGNOSIS — O09521 Supervision of elderly multigravida, first trimester: Secondary | ICD-10-CM | POA: Diagnosis not present

## 2019-09-19 DIAGNOSIS — Z3A09 9 weeks gestation of pregnancy: Secondary | ICD-10-CM | POA: Diagnosis not present

## 2019-09-19 DIAGNOSIS — O26891 Other specified pregnancy related conditions, first trimester: Secondary | ICD-10-CM | POA: Insufficient documentation

## 2019-09-19 DIAGNOSIS — R141 Gas pain: Secondary | ICD-10-CM | POA: Diagnosis not present

## 2019-09-19 DIAGNOSIS — Z7982 Long term (current) use of aspirin: Secondary | ICD-10-CM | POA: Diagnosis not present

## 2019-09-19 DIAGNOSIS — O131 Gestational [pregnancy-induced] hypertension without significant proteinuria, first trimester: Secondary | ICD-10-CM | POA: Diagnosis not present

## 2019-09-19 DIAGNOSIS — R110 Nausea: Secondary | ICD-10-CM | POA: Insufficient documentation

## 2019-09-19 DIAGNOSIS — R197 Diarrhea, unspecified: Secondary | ICD-10-CM | POA: Diagnosis not present

## 2019-09-19 DIAGNOSIS — N898 Other specified noninflammatory disorders of vagina: Secondary | ICD-10-CM

## 2019-09-19 LAB — URINALYSIS, ROUTINE W REFLEX MICROSCOPIC
Bilirubin Urine: NEGATIVE
Glucose, UA: NEGATIVE mg/dL
Hgb urine dipstick: NEGATIVE
Ketones, ur: 5 mg/dL — AB
Nitrite: NEGATIVE
Protein, ur: NEGATIVE mg/dL
Specific Gravity, Urine: 1.025 (ref 1.005–1.030)
pH: 6 (ref 5.0–8.0)

## 2019-09-19 LAB — WET PREP, GENITAL
Clue Cells Wet Prep HPF POC: NONE SEEN
Sperm: NONE SEEN
Trich, Wet Prep: NONE SEEN
Yeast Wet Prep HPF POC: NONE SEEN

## 2019-09-19 MED ORDER — HYOSCYAMINE SULFATE SL 0.125 MG SL SUBL
1.0000 | SUBLINGUAL_TABLET | Freq: Two times a day (BID) | SUBLINGUAL | 0 refills | Status: DC | PRN
Start: 2019-09-19 — End: 2020-02-19

## 2019-09-19 MED ORDER — SIMETHICONE 80 MG PO CHEW
80.0000 mg | CHEWABLE_TABLET | Freq: Four times a day (QID) | ORAL | 0 refills | Status: DC | PRN
Start: 2019-09-19 — End: 2020-01-22

## 2019-09-19 MED ORDER — SCOPOLAMINE 1 MG/3DAYS TD PT72
1.0000 | MEDICATED_PATCH | Freq: Once | TRANSDERMAL | Status: DC
  Administered 2019-09-19: 1.5 mg via TRANSDERMAL
  Filled 2019-09-19: qty 1

## 2019-09-19 MED ORDER — SCOPOLAMINE 1 MG/3DAYS TD PT72: 1.0000 | MEDICATED_PATCH | TRANSDERMAL | 12 refills | Status: DC

## 2019-09-19 MED ORDER — SIMETHICONE 80 MG PO CHEW
80.0000 mg | CHEWABLE_TABLET | Freq: Once | ORAL | Status: AC
  Administered 2019-09-19: 80 mg via ORAL
  Filled 2019-09-19: qty 1

## 2019-09-19 MED ORDER — FIBERCON 625 MG PO TABS
625.0000 mg | ORAL_TABLET | Freq: Every day | ORAL | 1 refills | Status: DC
Start: 2019-09-19 — End: 2019-12-05

## 2019-09-19 MED ORDER — HYOSCYAMINE SULFATE 0.125 MG SL SUBL
0.1250 mg | SUBLINGUAL_TABLET | Freq: Once | SUBLINGUAL | Status: AC
  Administered 2019-09-19: 0.125 mg via SUBLINGUAL
  Filled 2019-09-19: qty 1

## 2019-09-19 NOTE — Discharge Instructions (Signed)
Abdominal Bloating When you have abdominal bloating, your abdomen may feel full, tight, or painful. It may also look bigger than normal or swollen (distended). Common causes of abdominal bloating include:  Swallowing air.  Constipation.  Problems digesting food.  Eating too much.  Irritable bowel syndrome. This is a condition that affects the large intestine.  Lactose intolerance. This is an inability to digest lactose, a natural sugar in dairy products.  Celiac disease. This is a condition that affects the ability to digest gluten, a protein found in some grains.  Gastroparesis. This is a condition that slows down the movement of food in the stomach and small intestine. It is more common in people with diabetes mellitus.  Gastroesophageal reflux disease (GERD). This is a digestive condition that makes stomach acid flow back into the esophagus.  Urinary retention. This means that the body is holding onto urine, and the bladder cannot be emptied all the way. Follow these instructions at home: Eating and drinking  Avoid eating too much.  Try not to swallow air while talking or eating.  Avoid eating while lying down.  Avoid these foods and drinks: ? Foods that cause gas, such as broccoli, cabbage, cauliflower, and baked beans. ? Carbonated drinks. ? Hard candy. ? Chewing gum. Medicines  Take over-the-counter and prescription medicines only as told by your health care provider.  Take probiotic medicines. These medicines contain live bacteria or yeasts that can help digestion.  Take coated peppermint oil capsules. Activity  Try to exercise regularly. Exercise may help to relieve bloating that is caused by gas and relieve constipation. General instructions  Keep all follow-up visits as told by your health care provider. This is important. Contact a health care provider if:  You have nausea and vomiting.  You have diarrhea.  You have abdominal pain.  You have unusual  weight loss or weight gain.  You have severe pain, and medicines do not help. Get help right away if:  You have severe chest pain.  You have trouble breathing.  You have shortness of breath.  You have trouble urinating.  You have darker urine than normal.  You have blood in your stools or have dark, tarry stools. Summary  Abdominal bloating means that the abdomen is swollen.  Common causes of abdominal bloating are swallowing air, constipation, and problems digesting food.  Avoid eating too much and avoid swallowing air.  Avoid foods that cause gas, carbonated drinks, hard candy, and chewing gum. This information is not intended to replace advice given to you by your health care provider. Make sure you discuss any questions you have with your health care provider. Document Revised: 07/10/2018 Document Reviewed: 04/23/2016 Elsevier Patient Education  2020 [employer].  First Trimester of Pregnancy The first trimester of pregnancy is from week 1 until the end of week 13 (months 1 through 3). A week after a sperm fertilizes an egg, the egg will implant on the wall of the uterus. This embryo will begin to develop into a baby. Genes from you and your partner will form the baby. The female genes will determine whether the baby will be a boy or a girl. At 6-8 weeks, the eyes and face will be formed, and the heartbeat can be seen on ultrasound. At the end of 12 weeks, all the baby's organs will be formed. Now that you are pregnant, you will want to do everything you can to have a healthy baby. Two of the most important things are to get  good prenatal care and to follow your health care provider's instructions. Prenatal care is all the medical care you receive before the baby's birth. This care will help prevent, find, and treat any problems during the pregnancy and childbirth. Body changes during your first trimester Your body goes through many changes during pregnancy. The changes vary from  woman to woman.  You may gain or lose a couple of pounds at first.  You may feel sick to your stomach (nauseous) and you may throw up (vomit). If the vomiting is uncontrollable, call your health care provider.  You may tire easily.  You may develop headaches that can be relieved by medicines. All medicines should be approved by your health care provider.  You may urinate more often. Painful urination may mean you have a bladder infection.  You may develop heartburn as a result of your pregnancy.  You may develop constipation because certain hormones are causing the muscles that push stool through your intestines to slow down.  You may develop hemorrhoids or swollen veins (varicose veins).  Your breasts may begin to grow larger and become tender. Your nipples may stick out more, and the tissue that surrounds them (areola) may become darker.  Your gums may bleed and may be sensitive to brushing and flossing.  Dark spots or blotches (chloasma, mask of pregnancy) may develop on your face. This will likely fade after the baby is born.  Your menstrual periods will stop.  You may have a loss of appetite.  You may develop cravings for certain kinds of food.  You may have changes in your emotions from day to day, such as being excited to be pregnant or being concerned that something may go wrong with the pregnancy and baby.  You may have more vivid and strange dreams.  You may have changes in your hair. These can include thickening of your hair, rapid growth, and changes in texture. Some women also have hair loss during or after pregnancy, or hair that feels dry or thin. Your hair will most likely return to normal after your baby is born. What to expect at prenatal visits During a routine prenatal visit:  You will be weighed to make sure you and the baby are growing normally.  Your blood pressure will be taken.  Your abdomen will be measured to track your baby's growth.  The fetal  heartbeat will be listened to between weeks 10 and 14 of your pregnancy.  Test results from any previous visits will be discussed. Your health care provider may ask you:  How you are feeling.  If you are feeling the baby move.  If you have had any abnormal symptoms, such as leaking fluid, bleeding, severe headaches, or abdominal cramping.  If you are using any tobacco products, including cigarettes, chewing tobacco, and electronic cigarettes.  If you have any questions. Other tests that may be performed during your first trimester include:  Blood tests to find your blood type and to check for the presence of any previous infections. The tests will also be used to check for low iron levels (anemia) and protein on red blood cells (Rh antibodies). Depending on your risk factors, or if you previously had diabetes during pregnancy, you may have tests to check for high blood sugar that affects pregnant women (gestational diabetes).  Urine tests to check for infections, diabetes, or protein in the urine.  An ultrasound to confirm the proper growth and development of the baby.  Fetal screens for spinal  cord problems (spina bifida) and Down syndrome.  HIV (human immunodeficiency virus) testing. Routine prenatal testing includes screening for HIV, unless you choose not to have this test.  You may need other tests to make sure you and the baby are doing well. Follow these instructions at home: Medicines  Follow your health care provider's instructions regarding medicine use. Specific medicines may be either safe or unsafe to take during pregnancy.  Take a prenatal vitamin that contains at least 600 micrograms (mcg) of folic acid.  If you develop constipation, try taking a stool softener if your health care provider approves. Eating and drinking   Eat a balanced diet that includes fresh fruits and vegetables, whole grains, good sources of protein such as meat, eggs, or tofu, and low-fat  dairy. Your health care provider will help you determine the amount of weight gain that is right for you.  Avoid raw meat and uncooked cheese. These carry germs that can cause birth defects in the baby.  Eating four or five small meals rather than three large meals a day may help relieve nausea and vomiting. If you start to feel nauseous, eating a few soda crackers can be helpful. Drinking liquids between meals, instead of during meals, also seems to help ease nausea and vomiting.  Limit foods that are high in fat and processed sugars, such as fried and sweet foods.  To prevent constipation: ? Eat foods that are high in fiber, such as fresh fruits and vegetables, whole grains, and beans. ? Drink enough fluid to keep your urine clear or pale yellow. Activity  Exercise only as directed by your health care provider. Most women can continue their usual exercise routine during pregnancy. Try to exercise for 30 minutes at least 5 days a week. Exercising will help you: ? Control your weight. ? Stay in shape. ? Be prepared for labor and delivery.  Experiencing pain or cramping in the lower abdomen or lower back is a good sign that you should stop exercising. Check with your health care provider before continuing with normal exercises.  Try to avoid standing for long periods of time. Move your legs often if you must stand in one place for a long time.  Avoid heavy lifting.  Wear low-heeled shoes and practice good posture.  You may continue to have sex unless your health care provider tells you not to. Relieving pain and discomfort  Wear a good support bra to relieve breast tenderness.  Take warm sitz baths to soothe any pain or discomfort caused by hemorrhoids. Use hemorrhoid cream if your health care provider approves.  Rest with your legs elevated if you have leg cramps or low back pain.  If you develop varicose veins in your legs, wear support hose. Elevate your feet for 15 minutes, 3-4  times a day. Limit salt in your diet. Prenatal care  Schedule your prenatal visits by the twelfth week of pregnancy. They are usually scheduled monthly at first, then more often in the last 2 months before delivery.  Write down your questions. Take them to your prenatal visits.  Keep all your prenatal visits as told by your health care provider. This is important. Safety  Wear your seat belt at all times when driving.  Make a list of emergency phone numbers, including numbers for family, friends, the hospital, and police and fire departments. General instructions  Ask your health care provider for a referral to a local prenatal education class. Begin classes no later than the beginning  of month 6 of your pregnancy.  Ask for help if you have counseling or nutritional needs during pregnancy. Your health care provider can offer advice or refer you to specialists for help with various needs.  Do not use hot tubs, steam rooms, or saunas.  Do not douche or use tampons or scented sanitary pads.  Do not cross your legs for long periods of time.  Avoid cat litter boxes and soil used by cats. These carry germs that can cause birth defects in the baby and possibly loss of the fetus by miscarriage or stillbirth.  Avoid all smoking, herbs, alcohol, and medicines not prescribed by your health care provider. Chemicals in these products affect the formation and growth of the baby.  Do not use any products that contain nicotine or tobacco, such as cigarettes and e-cigarettes. If you need help quitting, ask your health care provider. You may receive counseling support and other resources to help you quit.  Schedule a dentist appointment. At home, brush your teeth with a soft toothbrush and be gentle when you floss. Contact a health care provider if:  You have dizziness.  You have mild pelvic cramps, pelvic pressure, or nagging pain in the abdominal area.  You have persistent nausea, vomiting, or  diarrhea.  You have a bad smelling vaginal discharge.  You have pain when you urinate.  You notice increased swelling in your face, hands, legs, or ankles.  You are exposed to fifth disease or chickenpox.  You are exposed to [place] measles (rubella) and have never had it. Get help right away if:  You have a fever.  You are leaking fluid from your vagina.  You have spotting or bleeding from your vagina.  You have severe abdominal cramping or pain.  You have rapid weight gain or loss.  You vomit blood or material that looks like coffee grounds.  You develop a severe headache.  You have shortness of breath.  You have any kind of trauma, such as from a fall or a car accident. Summary  The first trimester of pregnancy is from week 1 until the end of week 13 (months 1 through 3).  Your body goes through many changes during pregnancy. The changes vary from woman to woman.  You will have routine prenatal visits. During those visits, your health care provider will examine you, discuss any test results you may have, and talk with you about how you are feeling. This information is not intended to replace advice given to you by your health care provider. Make sure you discuss any questions you have with your health care provider. Document Revised: 03/04/2017 Document Reviewed: 03/03/2016 Elsevier Patient Education  2020 [employer]  Morning Sickness  Morning sickness is when a woman feels nauseous during pregnancy. This nauseous feeling may or may not come with vomiting. It often occurs in the morning, but it can be a problem at any time of day. Morning sickness is most common during the first trimester. In some cases, it may continue throughout pregnancy. Although morning sickness is unpleasant, it is usually harmless unless the woman develops severe and continual vomiting (hyperemesis gravidarum), a condition that requires more intense treatment. What are the causes? The exact  cause of this condition is not known, but it seems to be related to normal hormonal changes that occur in pregnancy. What increases the risk? You are more likely to develop this condition if:  You experienced nausea or vomiting before your pregnancy.  You had morning sickness  during a previous pregnancy.  You are pregnant with more than one baby, such as twins. What are the signs or symptoms? Symptoms of this condition include:  Nausea.  Vomiting. How is this diagnosed? This condition is usually diagnosed based on your signs and symptoms. How is this treated? In many cases, treatment is not needed for this condition. Making some changes to what you eat may help to control symptoms. Your health care provider may also prescribe or recommend:  Vitamin B6 supplements.  Anti-nausea medicines.  Ginger. Follow these instructions at home: Medicines  Take over-the-counter and prescription medicines only as told by your health care provider. Do not use any prescription, over-the-counter, or herbal medicines for morning sickness without first talking with your health care provider.  Taking multivitamins before getting pregnant can prevent or decrease the severity of morning sickness in most women. Eating and drinking  Eat a piece of dry toast or crackers before getting out of bed in the morning.  Eat 5 or 6 small meals a day.  Eat dry and bland foods, such as rice or a baked potato. Foods that are high in carbohydrates are often helpful.  Avoid greasy, fatty, and spicy foods.  Have someone cook for you if the smell of any food causes nausea and vomiting.  If you feel nauseous after taking prenatal vitamins, take the vitamins at night or with a snack.  Snack on protein foods between meals if you are hungry. Nuts, yogurt, and cheese are good options.  Drink fluids throughout the day.  Try ginger ale made with real ginger, ginger tea made from fresh grated ginger, or ginger  candies. General instructions  Do not use any products that contain nicotine or tobacco, such as cigarettes and e-cigarettes. If you need help quitting, ask your health care provider.  Get an air purifier to keep the air in your house free of odors.  Get plenty of fresh air.  Try to avoid odors that trigger your nausea.  Consider trying these methods to help relieve symptoms: ? Wearing an acupressure wristband. These wristbands are often worn for seasickness. ? Acupuncture. Contact a health care provider if:  Your home remedies are not working and you need medicine.  You feel dizzy or light-headed.  You are losing weight. Get help right away if:  You have persistent and uncontrolled nausea and vomiting.  You faint.  You have severe pain in your abdomen. Summary  Morning sickness is when a woman feels nauseous during pregnancy. This nauseous feeling may or may not come with vomiting.  Morning sickness is most common during the first trimester.  It often occurs in the morning, but it can be a problem at any time of day.  In many cases, treatment is not needed for this condition. Making some changes to what you eat may help to control symptoms. This information is not intended to replace advice given to you by your health care provider. Make sure you discuss any questions you have with your health care provider. Document Revised: 03/04/2017 Document Reviewed: 04/24/2016 Elsevier Patient Education  2020 [employer]

## 2019-09-19 NOTE — MAU Provider Note (Signed)
Chief Complaint: Nausea   First Provider Initiated Contact with Patient 09/19/19 2113        SUBJECTIVE HPI: Jackie Rojas is a 34 y.o. [id number] at [redacted]w[redacted]d by LMP who presents to maternity admissions reporting multiple complaints.  Has had nausea with no vomiting.  Was prescribed meds but did not pick them up because she was waiting on pharm to call (reviewed process with her).  Also having gas pains and flatus.  Also c/o vaginal discharge with odor.  Frustrated and wants to feel better She denies vaginal bleeding, vaginal itching/burning, urinary symptoms, h/a, dizziness, or fever/chills.  Has been followed by [facility] and is scheduled for a dating [place] next week.    Abdominal Pain This is a recurrent problem. The current episode started 1 to 4 weeks ago. The onset quality is gradual. The problem occurs intermittently. The problem has been unchanged. The pain is located in the generalized abdominal region. The pain is mild. The quality of the pain is cramping and colicky. The abdominal pain does not radiate. Associated symptoms include belching, flatus and nausea. Pertinent negatives include no anorexia, constipation, diarrhea, dysuria, fever, frequency, headaches, myalgias or vomiting. Nothing aggravates the pain. The pain is relieved by nothing. She has tried nothing for the symptoms.  Vaginal Discharge The patient's primary symptoms include a genital odor and vaginal discharge. The patient's pertinent negatives include no genital itching, genital lesions, pelvic pain or vaginal bleeding. The current episode started 1 to 4 weeks ago. The problem occurs intermittently. The problem has been unchanged. She is pregnant. Associated symptoms include abdominal pain and nausea. Pertinent negatives include no anorexia, constipation, diarrhea, dysuria, fever, frequency, headaches or vomiting. The vaginal discharge was milky and malodorous. There has been no bleeding. She has not been passing clots. She has not  been passing tissue. Nothing aggravates the symptoms. She has tried nothing for the symptoms.   RN Note: Pt states that she has been having ongoing nausea with this pregnancy, but reports not being able to vomit.  Pt reports stiff neck the last couple of days.  Pt reports loose stool 2 times today.  Pt reports feeling trapped gas, but is able to pass forceful gas.  Pt reports intermittent feeling of chills for the last two days.  Pt reports a bad vaginal odor and white thick creamy discharge.   Past Medical History:  Diagnosis Date  . Gallstones   . Gestational hypertension    PP  . HSV-2 infection    Past Surgical History:  Procedure Laterality Date  . TONSILLECTOMY     Social History   Socioeconomic History  . Marital status: Single    Spouse name: Not on file  . Number of children: Not on file  . Years of education: Not on file  . Highest education level: Not on file  Occupational History  . Not on file  Tobacco Use  . Smoking status: Never Smoker  . Smokeless tobacco: Never Used  Vaping Use  . Vaping Use: Never used  Substance and Sexual Activity  . Alcohol use: Not Currently    Comment: occasionally   . Drug use: Never  . Sexual activity: Not Currently    Birth control/protection: None  Other Topics Concern  . Not on file  Social History Narrative  . Not on file   Social Determinants of Health   Financial Resource Strain:   . Difficulty of Paying Living Expenses:   Food Insecurity:   . Worried About Radiation protection practitioner  of Food in the Last Year:   . Ran Out of Food in the Last Year:   Transportation Needs:   . Lack of Transportation (Medical):   Marland Kitchen Lack of Transportation (Non-Medical):   Physical Activity:   . Days of Exercise per Week:   . Minutes of Exercise per Session:   Stress:   . Feeling of Stress :   Social Connections:   . Frequency of Communication with Friends and Family:   . Frequency of Social Gatherings with Friends and Family:   .  Attends Religious Services:   . Active Member of Clubs or Organizations:   . Attends Banker Meetings:   Marland Kitchen Marital Status:   Intimate Partner Violence:   . Fear of Current or Ex-Partner:   . Emotionally Abused:   Marland Kitchen Physically Abused:   . Sexually Abused:    No current facility-administered medications on file prior to encounter.   Current Outpatient Medications on File Prior to Encounter  Medication Sig Dispense Refill  . aspirin-acetaminophen-caffeine (EXCEDRIN MIGRAINE) 250-250-65 MG tablet Take 2 tablets by mouth every 6 (six) hours as needed.     . Doxylamine-Pyridoxine (DICLEGIS) 10-10 MG TBEC Take 2 at hs and 1 in am and 1 in afternoon, no more than 4 a day 120 tablet 3  . fluticasone (FLONASE) 50 MCG/ACT nasal spray Place 1 spray into both nostrils daily as needed for allergies or rhinitis.    . valACYclovir (VALTREX) 500 MG tablet Take 2,000 mg by mouth daily as needed.     No Known Allergies  I have reviewed patient's Past Medical Hx, Surgical Hx, Family Hx, Social Hx, medications and allergies.   ROS:  Review of Systems  Constitutional: Negative for fever.  Gastrointestinal: Positive for abdominal pain, flatus and nausea. Negative for anorexia, constipation, diarrhea and vomiting.  Genitourinary: Positive for vaginal discharge. Negative for dysuria, frequency and pelvic pain.  Musculoskeletal: Negative for myalgias.  Neurological: Negative for headaches.   Review of Systems  Other systems negative   Physical Exam  Physical Exam Patient Vitals for the past 24 hrs:  BP Temp Pulse Resp SpO2 Weight  09/19/19 2056 132/86 98.3 F (36.8 C) (!) 111 (!) 24 99 % --  09/19/19 2054 -- -- -- -- -- 133.8 kg   Constitutional: Well-developed, well-nourished female in no acute distress.  Cardiovascular: normal rate Respiratory: normal effort GI: Abd soft, non-tender. Pos BS x 4 MS: Extremities nontender, no edema, normal ROM Neurologic: Alert and oriented x 4.   GU: Neg CVAT.  PELVIC EXAM: Declines speculum exam.  Minimal white discharge, neg whiff   LAB RESULTS Results for orders placed or performed during the hospital encounter of 09/19/19 (from the past 24 hour(s))  Urinalysis, Routine w reflex microscopic     Status: Abnormal   Collection Time: 09/19/19  8:55 PM  Result Value Ref Range   Color, Urine YELLOW YELLOW   APPearance CLEAR CLEAR   Specific Gravity, Urine 1.025 1.005 - 1.030   pH 6.0 5.0 - 8.0   Glucose, UA NEGATIVE NEGATIVE mg/dL   Hgb urine dipstick NEGATIVE NEGATIVE   Bilirubin Urine NEGATIVE NEGATIVE   Ketones, ur 5 (A) NEGATIVE mg/dL   Protein, ur NEGATIVE NEGATIVE mg/dL   Nitrite NEGATIVE NEGATIVE   Leukocytes,Ua SMALL (A) NEGATIVE   RBC / HPF 0-5 0 - 5 RBC/hpf   WBC, UA 0-5 0 - 5 WBC/hpf   Bacteria, UA RARE (A) NONE SEEN   Squamous Epithelial / LPF 0-5  0 - 5   Mucus PRESENT   Wet prep, genital     Status: Abnormal   Collection Time: 09/19/19  9:40 PM  Result Value Ref Range   Yeast Wet Prep HPF POC NONE SEEN NONE SEEN   Trich, Wet Prep NONE SEEN NONE SEEN   Clue Cells Wet Prep HPF POC NONE SEEN NONE SEEN   WBC, Wet Prep HPF POC MODERATE (A) NONE SEEN   Sperm NONE SEEN      --/--/O POS (05/25 1001)  IMAGING Scheduled for dating [place] next week I did an informal bedside US for reassurance, able to see a single IUP with heart motion, measuring approx 7wks, but since it was not a transvaginal, will not document that in dating.   Fetus definitely did not appear to be 9 weeks.   MAU Management/MDM: Exam is consistent with intestinal gas and bloating.  No acute findings were noted. Pain was relieved by administration of a cocktail composed of Levsin and Simethicone. Reassured patient that this pain is likely resulting from intestinal disturbances common to pregnancy.  This can cause irregular digestive patterns and result in cramping and gas pains.   She did feel better after Levsin, Scopolamine patch and  Mylicon Bedside US reassured her Wet prep negative, discussed.  ASSESSMENT Single IUP at [redacted]w[redacted]d by LMP, likely more like 7 weeks Abdominal cramping and bloating, related to progesterone effects on digestive system Vaginal discharge, physiologic Nausea without vomiting  PLAN Discharge home Rx Watson with water for digestive aid Rx Levsin for occasional use prn cramps Rx Scopolamine patch for nausea Recommend go pick up Diclegis from pharmacy Rx SImethicone for gas Keep appt as scheduled for [place]  Pt stable at time of discharge. Encouraged to return here or to other Urgent Care/ED if she develops worsening of symptoms, increase in pain, fever, or other concerning symptoms.    Hansel Feinstein CNM, MSN Certified Nurse-Midwife 09/19/2019  9:13 PM

## 2019-09-19 NOTE — MAU Note (Signed)
Pt states that she has been having ongoing nausea with this pregnancy, but reports not being able to vomit.   Pt reports stiff neck the last couple of days.   Pt reports loose stool 2 times today.   Pt reports feeling trapped gas, but is able to pass forceful gas.   Pt reports intermittent feeling of chills for the last two days.   Pt reports a bad vaginal odor and white thick creamy discharge.

## 2019-09-24 ENCOUNTER — Other Ambulatory Visit: Payer: Self-pay | Admitting: Obstetrics & Gynecology

## 2019-09-24 DIAGNOSIS — O3680X0 Pregnancy with inconclusive fetal viability, not applicable or unspecified: Secondary | ICD-10-CM

## 2019-09-25 ENCOUNTER — Ambulatory Visit (INDEPENDENT_AMBULATORY_CARE_PROVIDER_SITE_OTHER): Payer: BC Managed Care – PPO

## 2019-09-25 DIAGNOSIS — O3680X0 Pregnancy with inconclusive fetal viability, not applicable or unspecified: Secondary | ICD-10-CM

## 2019-09-25 DIAGNOSIS — Z3A01 Less than 8 weeks gestation of pregnancy: Secondary | ICD-10-CM

## 2019-09-25 NOTE — Progress Notes (Signed)
[place] 7+5 wks,single IUP with ys,positive fht 157 bpm,normal ovaries,crl 14.64 mm

## 2019-10-03 ENCOUNTER — Emergency Department (HOSPITAL_COMMUNITY)
Admission: EM | Admit: 2019-10-03 | Discharge: 2019-10-03 | Disposition: A | Discharge status: Home / Self Care | Payer: BC Managed Care – PPO | Attending: Emergency Medicine | Admitting: Emergency Medicine

## 2019-10-03 ENCOUNTER — Other Ambulatory Visit: Payer: Self-pay

## 2019-10-03 ENCOUNTER — Telehealth: Payer: Self-pay | Admitting: Women's Health

## 2019-10-03 ENCOUNTER — Encounter (HOSPITAL_COMMUNITY): Payer: Self-pay | Admitting: *Deleted

## 2019-10-03 DIAGNOSIS — R42 Dizziness and giddiness: Secondary | ICD-10-CM | POA: Insufficient documentation

## 2019-10-03 DIAGNOSIS — Z3A09 9 weeks gestation of pregnancy: Secondary | ICD-10-CM | POA: Insufficient documentation

## 2019-10-03 DIAGNOSIS — E860 Dehydration: Secondary | ICD-10-CM | POA: Insufficient documentation

## 2019-10-03 DIAGNOSIS — O211 Hyperemesis gravidarum with metabolic disturbance: Secondary | ICD-10-CM | POA: Diagnosis not present

## 2019-10-03 DIAGNOSIS — O99281 Endocrine, nutritional and metabolic diseases complicating pregnancy, first trimester: Secondary | ICD-10-CM | POA: Diagnosis present

## 2019-10-03 DIAGNOSIS — O210 Mild hyperemesis gravidarum: Secondary | ICD-10-CM

## 2019-10-03 LAB — COMPREHENSIVE METABOLIC PANEL
ALT: 13 U/L (ref 0–44)
AST: 14 U/L — ABNORMAL LOW (ref 15–41)
Albumin: 3.1 g/dL — ABNORMAL LOW (ref 3.5–5.0)
Alkaline Phosphatase: 52 U/L (ref 38–126)
Anion gap: 9 (ref 5–15)
BUN: 13 mg/dL (ref 6–20)
CO2: 25 mmol/L (ref 22–32)
Calcium: 8.8 mg/dL — ABNORMAL LOW (ref 8.9–10.3)
Chloride: 102 mmol/L (ref 98–111)
Creatinine, Ser: 0.68 mg/dL (ref 0.44–1.00)
GFR calc Af Amer: >60 mL/min (ref >60)
GFR calc non Af Amer: >60 mL/min (ref >60)
Glucose, Bld: 79 mg/dL (ref 70–99)
Potassium: 3.9 mmol/L (ref 3.5–5.1)
Sodium: 136 mmol/L (ref 135–145)
Total Bilirubin: 0.3 mg/dL (ref 0.3–1.2)
Total Protein: 7.2 g/dL (ref 6.5–8.1)

## 2019-10-03 LAB — URINALYSIS, ROUTINE W REFLEX MICROSCOPIC
Bilirubin Urine: NEGATIVE
Glucose, UA: NEGATIVE mg/dL
Hgb urine dipstick: NEGATIVE
Ketones, ur: 5 mg/dL — AB
Leukocytes,Ua: NEGATIVE
Nitrite: NEGATIVE
Protein, ur: NEGATIVE mg/dL
Specific Gravity, Urine: 1.03 (ref 1.005–1.030)
pH: 5 (ref 5.0–8.0)

## 2019-10-03 LAB — CBC WITH DIFFERENTIAL/PLATELET
Abs Immature Granulocytes: 0.02 10*3/uL (ref 0.00–0.07)
Basophils Absolute: 0 10*3/uL (ref 0.0–0.1)
Basophils Relative: 0 %
Eosinophils Absolute: 0.1 10*3/uL (ref 0.0–0.5)
Eosinophils Relative: 1 %
HCT: 39.1 % (ref 36.0–46.0)
Hemoglobin: 12.1 g/dL (ref 12.0–15.0)
Immature Granulocytes: 0 %
Lymphocytes Relative: 29 %
Lymphs Abs: 2.7 10*3/uL (ref 0.7–4.0)
MCH: 25.9 pg — ABNORMAL LOW (ref 26.0–34.0)
MCHC: 30.9 g/dL (ref 30.0–36.0)
MCV: 83.5 fL (ref 80.0–100.0)
Monocytes Absolute: 1 10*3/uL (ref 0.1–1.0)
Monocytes Relative: 11 %
Neutro Abs: 5.7 10*3/uL (ref 1.7–7.7)
Neutrophils Relative %: 59 %
Platelets: 284 10*3/uL (ref 150–400)
RBC: 4.68 MIL/uL (ref 3.87–5.11)
RDW: 16.9 % — ABNORMAL HIGH (ref 11.5–15.5)
WBC: 9.5 10*3/uL (ref 4.0–10.5)
nRBC: 0 % (ref 0.0–0.2)

## 2019-10-03 LAB — LIPASE, BLOOD: Lipase: 22 U/L (ref 11–51)

## 2019-10-03 LAB — CBG MONITORING, ED
Glucose-Capillary: 69 mg/dL — ABNORMAL LOW (ref 70–99)
Glucose-Capillary: 84 mg/dL (ref 70–99)

## 2019-10-03 MED ORDER — ONDANSETRON 4 MG PO TBDP
4.0000 mg | ORAL_TABLET | Freq: Three times a day (TID) | ORAL | 0 refills | Status: DC | PRN
Start: 2019-10-03 — End: 2019-10-14

## 2019-10-03 MED ORDER — ONDANSETRON 4 MG PO TBDP
4.0000 mg | ORAL_TABLET | Freq: Once | ORAL | Status: AC
  Administered 2019-10-03: 4 mg via ORAL
  Filled 2019-10-03: qty 1

## 2019-10-03 MED ORDER — SODIUM CHLORIDE 0.9 % IV BOLUS
1000.0000 mL | Freq: Once | INTRAVENOUS | Status: AC
  Administered 2019-10-03: 1000 mL via INTRAVENOUS

## 2019-10-03 NOTE — ED Notes (Signed)
Unable to establish IV 

## 2019-10-03 NOTE — Telephone Encounter (Signed)
Patient called stating that she is pregnant and she has been throwing up so much, pt states that she can not keep anything down. She has been experiencing dizziness and she states that she has stiff neck as well. please contact pt

## 2019-10-03 NOTE — ED Triage Notes (Signed)
Pt c/o nausea, dry heaves and dizziness x weeks. Pt has used "a patch behind my ear" and another prescription medicine and neither have helped. Pt is currently [redacted] weeks pregnant. Pt has not ate or drink in 2 days. Pt was sent to ED by [facility].

## 2019-10-03 NOTE — ED Provider Notes (Signed)
[facility] EMERGENCY DEPARTMENT Provider Note   CSN: [id number] Arrival date & time: 10/03/19  1356     History Chief Complaint  Patient presents with  . Emesis During Pregnancy    Jackie Rojas is a 34 y.o. female [phone], [redacted] weeks pregnant sent in today by OB/GYN for concern of dehydration.  Patient reports 2 days of nausea and vomiting which has caused her to feel lightheaded.  She reports that she has not been able to eat around 2 days due to her symptoms.  She is attempted Diclegis and a scopolamine patch without relief of symptoms.  She denies fever/chills, headache, vision changes, neck pain, sore throat, chest pain, shortness of breath, cough, abdominal pain, diarrhea, pelvic pain, dysuria/hematuria, vaginal bleeding/discharge, fluid loss, extremity swelling/color change, numbness/tingling, weakness or any additional concerns. HPI     Past Medical History:  Diagnosis Date  . Gallstones   . Gestational hypertension    PP  . HSV-2 infection     There are no problems to display for this patient.   Past Surgical History:  Procedure Laterality Date  . TONSILLECTOMY       OB History    Gravida  5   Para  3   Term  3   Preterm      AB  1   Living  3     SAB      TAB      Ectopic      Multiple      Live Births              No family history on file.  Social History   Tobacco Use  . Smoking status: Never Smoker  . Smokeless tobacco: Never Used  Vaping Use  . Vaping Use: Never used  Substance Use Topics  . Alcohol use: Not Currently  . Drug use: Never    Home Medications Prior to Admission medications   Medication Sig Start Date End Date Taking? Authorizing Provider  aspirin-acetaminophen-caffeine (EXCEDRIN MIGRAINE) [phone] MG tablet Take 2 tablets by mouth every 6 (six) hours as needed.     [provider]  Doxylamine-Pyridoxine (DICLEGIS) 10-10 MG TBEC Take 2 at hs and 1 in am and 1 in afternoon, no more than 4 a day  09/18/19   Adline Potter, NP  fluticasone (FLONASE) 50 MCG/ACT nasal spray Place 1 spray into both nostrils daily as needed for allergies or rhinitis.    [provider]  Hyoscyamine Sulfate SL (LEVSIN/SL) 0.125 MG SUBL Place 1 tablet under the tongue every 12 (twelve) hours as needed (gas cramps). 09/19/19   Aviva Signs, CNM  ondansetron (ZOFRAN ODT) 4 MG disintegrating tablet Take 1 tablet (4 mg total) by mouth every 8 (eight) hours as needed for nausea or vomiting. 10/03/19   Bill Salinas, PA-C  polycarbophil (FIBERCON) 625 MG tablet Take 1 tablet (625 mg total) by mouth daily. 09/19/19   Aviva Signs, CNM  scopolamine (TRANSDERM-SCOP) 1 MG/3DAYS Place 1 patch (1.5 mg total) onto the skin every 3 (three) days. 09/19/19   Aviva Signs, CNM  simethicone (GAS-X) 80 MG chewable tablet Chew 1 tablet (80 mg total) by mouth every 6 (six) hours as needed for flatulence. 09/19/19   Aviva Signs, CNM  valACYclovir (VALTREX) 500 MG tablet Take 2,000 mg by mouth daily as needed.    [provider]    Allergies    Patient has no known allergies.  Review of Systems  Review of Systems Ten systems are reviewed and are negative for acute change except as noted in the HPI  Physical Exam Updated Vital Signs BP 132/68   Pulse 98   Temp 98.1 F (36.7 C) (Oral)   Resp 16   Ht 5\' 6"  (1.676 m)   Wt 127 kg   LMP 07/17/2019 (Approximate)   SpO2 98%   BMI 45.19 kg/m   Physical Exam Constitutional:      General: She is not in acute distress.    Appearance: Normal appearance. She is well-developed. She is not ill-appearing or diaphoretic.  HENT:     Head: Normocephalic and atraumatic.  Eyes:     General: Vision grossly intact. Gaze aligned appropriately.     Pupils: Pupils are equal, round, and reactive to light.  Neck:     Trachea: Trachea and phonation normal.  Pulmonary:     Effort: Pulmonary effort is normal. No respiratory distress.  Abdominal:      General: There is no distension.     Palpations: Abdomen is soft.     Tenderness: There is no abdominal tenderness. There is no guarding or rebound.  Genitourinary:    Comments: Refused by patient Musculoskeletal:        General: Normal range of motion.     Cervical back: Normal range of motion.  Skin:    General: Skin is warm and dry.  Neurological:     Mental Status: She is alert.     GCS: GCS eye subscore is 4. GCS verbal subscore is 5. GCS motor subscore is 6.     Comments: Speech is clear and goal oriented, follows commands Major Cranial nerves without deficit, no facial droop Moves extremities without ataxia, coordination intact  Psychiatric:        Behavior: Behavior normal.     ED Results / Procedures / Treatments   Labs (all labs ordered are listed, but only abnormal results are displayed) Labs Reviewed  CBC WITH DIFFERENTIAL/PLATELET - Abnormal; Notable for the following components:      Result Value   MCH 25.9 (*)    RDW 16.9 (*)    All other components within normal limits  COMPREHENSIVE METABOLIC PANEL - Abnormal; Notable for the following components:   Calcium 8.8 (*)    Albumin 3.1 (*)    AST 14 (*)    All other components within normal limits  URINALYSIS, ROUTINE W REFLEX MICROSCOPIC - Abnormal; Notable for the following components:   APPearance HAZY (*)    Ketones, ur 5 (*)    All other components within normal limits  CBG MONITORING, ED - Abnormal; Notable for the following components:   Glucose-Capillary 69 (*)    All other components within normal limits  LIPASE, BLOOD  CBG MONITORING, ED    EKG None  Radiology No results found.  Procedures Procedures (including critical care time)  Medications Ordered in ED Medications  ondansetron (ZOFRAN-ODT) disintegrating tablet 4 mg (4 mg Oral Given 10/03/19 2039)  sodium chloride 0.9 % bolus 1,000 mL (0 mLs Intravenous Stopped 10/03/19 2205)    ED Course  I have reviewed the triage vital signs and  the nursing notes.  Pertinent labs & imaging results that were available during my care of the patient were reviewed by me and considered in my medical decision making (see chart for details).    MDM Rules/Calculators/A&P  Additional History Obtained: 1. Nursing notes from this visit. 2. Reviewed EMR system, patient was seen in the MAU on 09/19/2019 for nausea and vomiting in pregnancy.  She had confirmed IUP at that time. - I ordered, reviewed and interpreted labs which include: CBC shows no leukocytosis to suggest infection and no evidence of anemia. Urinalysis shows 5 ketones, suspect this is from dehydration, she does not appear to be in DKA.  Additionally no proteinuria. Lipase within normal limits doubt pancreatitis. CMP shows no emergent electrolyte derangement, evidence of acute kidney injury, emergent elevation of LFTs or anion gap. CBG was initially minimally low at 69, this improved following food and drink. - Patient symptoms improved following ODT Zofran, patient states understanding's of Zofran in pregnancy and is agreeable to this treatment.  Plan of care is to discharge with small amount of Zofran to treat hyperemesis, she will follow-up with her OB/GYN for reassessment.  Patient tolerating p.o. at discharge, on reexamination she has no abdominal pain well-appearing no acute distress.  Suspect symptoms secondary to pregnancy, doubt appendicitis, SBO, cholecystitis or other emergent pathologies at this time.  At this time there does not appear to be any evidence of an acute emergency medical condition and the patient appears stable for discharge with appropriate outpatient follow up. Diagnosis was discussed with patient who verbalizes understanding of care plan and is agreeable to discharge. I have discussed return precautions with patient who verbalizes understanding. Patient encouraged to follow-up with their PCP. All questions answered.  Patient's case  discussed with Dr. Particia Nearing who agrees with plan to discharge with Zofran and follow-up.   Note: Portions of this report may have been transcribed using voice recognition software. Every effort was made to ensure accuracy; however, inadvertent computerized transcription errors may still be present. Final Clinical Impression(s) / ED Diagnoses Final diagnoses:  Hyperemesis gravidarum    Rx / DC Orders ED Discharge Orders         Ordered    ondansetron (ZOFRAN ODT) 4 MG disintegrating tablet  Every 8 hours PRN     Discontinue  Reprint     10/03/19 2305           Bill Salinas, PA-C 10/03/19 2306    Jacalyn Lefevre, MD 10/03/19 [phone]

## 2019-10-03 NOTE — ED Notes (Signed)
Ginger ale, apple juice, and water provided.

## 2019-10-03 NOTE — Telephone Encounter (Signed)
Called patient back. She states that she has been vomiting all night and the just having dry heaves. She says that the room is spinning when she lays down. She is using the patches but not helping. Patient advised to go to ER for fluids. No other questions at this time.

## 2019-10-03 NOTE — Discharge Instructions (Addendum)
At this time there does not appear to be the presence of an emergent medical condition, however there is always the potential for conditions to change. Please read and follow the below instructions.  Please return to the Emergency Department immediately for any new or worsening symptoms. Please be sure to follow up with your Primary Care Provider within one week regarding your visit today; please call their office to schedule an appointment even if you are feeling better for a follow-up visit. You may use the nausea medication Zofran as prescribed to help with nausea and vomiting.  This medication will dissolve under your tongue.  Only use it if your other medications do not help. Get help right away if: You cannot drink fluids without vomiting. You vomit blood. You have constant nausea and vomiting. You are very weak. You faint. You have a fever and your symptoms suddenly get worse. You have any new/concerning or worsening of symptoms  Please read the additional information packets attached to your discharge summary.  Do not take your medicine if  develop an itchy rash, swelling in your mouth or lips, or difficulty breathing; call 911 and seek immediate emergency medical attention if this occurs.  You may review your lab tests and imaging results in their entirety on your MyChart account.  Please discuss all results of fully with your primary care provider and other specialist at your follow-up visit.  Note: Portions of this text may have been transcribed using voice recognition software. Every effort was made to ensure accuracy; however, inadvertent computerized transcription errors may still be present.

## 2019-10-14 ENCOUNTER — Encounter (HOSPITAL_COMMUNITY): Payer: Self-pay | Admitting: Emergency Medicine

## 2019-10-14 ENCOUNTER — Emergency Department (HOSPITAL_COMMUNITY)
Admission: EM | Admit: 2019-10-14 | Discharge: 2019-10-14 | Disposition: A | Discharge status: Home / Self Care | Payer: BC Managed Care – PPO | Attending: Emergency Medicine | Admitting: Emergency Medicine

## 2019-10-14 ENCOUNTER — Other Ambulatory Visit: Payer: Self-pay

## 2019-10-14 DIAGNOSIS — Z7982 Long term (current) use of aspirin: Secondary | ICD-10-CM | POA: Diagnosis not present

## 2019-10-14 DIAGNOSIS — Z3A10 10 weeks gestation of pregnancy: Secondary | ICD-10-CM | POA: Insufficient documentation

## 2019-10-14 DIAGNOSIS — O219 Vomiting of pregnancy, unspecified: Secondary | ICD-10-CM | POA: Insufficient documentation

## 2019-10-14 LAB — URINALYSIS, ROUTINE W REFLEX MICROSCOPIC
Bilirubin Urine: NEGATIVE
Glucose, UA: NEGATIVE mg/dL
Hgb urine dipstick: NEGATIVE
Ketones, ur: NEGATIVE mg/dL
Leukocytes,Ua: NEGATIVE
Nitrite: NEGATIVE
Protein, ur: NEGATIVE mg/dL
Specific Gravity, Urine: 1.019 (ref 1.005–1.030)
pH: 7 (ref 5.0–8.0)

## 2019-10-14 LAB — CBC WITH DIFFERENTIAL/PLATELET
Abs Immature Granulocytes: 0.02 10*3/uL (ref 0.00–0.07)
Basophils Absolute: 0 10*3/uL (ref 0.0–0.1)
Basophils Relative: 1 %
Eosinophils Absolute: 0.1 10*3/uL (ref 0.0–0.5)
Eosinophils Relative: 1 %
HCT: 37.1 % (ref 36.0–46.0)
Hemoglobin: 11.4 g/dL — ABNORMAL LOW (ref 12.0–15.0)
Immature Granulocytes: 0 %
Lymphocytes Relative: 32 %
Lymphs Abs: 2.8 10*3/uL (ref 0.7–4.0)
MCH: 25.5 pg — ABNORMAL LOW (ref 26.0–34.0)
MCHC: 30.7 g/dL (ref 30.0–36.0)
MCV: 83 fL (ref 80.0–100.0)
Monocytes Absolute: 0.9 10*3/uL (ref 0.1–1.0)
Monocytes Relative: 11 %
Neutro Abs: 4.9 10*3/uL (ref 1.7–7.7)
Neutrophils Relative %: 55 %
Platelets: 228 10*3/uL (ref 150–400)
RBC: 4.47 MIL/uL (ref 3.87–5.11)
RDW: 16.6 % — ABNORMAL HIGH (ref 11.5–15.5)
WBC: 8.8 10*3/uL (ref 4.0–10.5)
nRBC: 0 % (ref 0.0–0.2)

## 2019-10-14 LAB — COMPREHENSIVE METABOLIC PANEL
ALT: 13 U/L (ref 0–44)
AST: 14 U/L — ABNORMAL LOW (ref 15–41)
Albumin: 2.8 g/dL — ABNORMAL LOW (ref 3.5–5.0)
Alkaline Phosphatase: 44 U/L (ref 38–126)
Anion gap: 7 (ref 5–15)
BUN: 10 mg/dL (ref 6–20)
CO2: 24 mmol/L (ref 22–32)
Calcium: 8.4 mg/dL — ABNORMAL LOW (ref 8.9–10.3)
Chloride: 103 mmol/L (ref 98–111)
Creatinine, Ser: 0.66 mg/dL (ref 0.44–1.00)
GFR calc Af Amer: >60 mL/min (ref >60)
GFR calc non Af Amer: >60 mL/min (ref >60)
Glucose, Bld: 99 mg/dL (ref 70–99)
Potassium: 4.1 mmol/L (ref 3.5–5.1)
Sodium: 134 mmol/L — ABNORMAL LOW (ref 135–145)
Total Bilirubin: 0.2 mg/dL — ABNORMAL LOW (ref 0.3–1.2)
Total Protein: 6.9 g/dL (ref 6.5–8.1)

## 2019-10-14 LAB — LIPASE, BLOOD: Lipase: 21 U/L (ref 11–51)

## 2019-10-14 MED ORDER — SODIUM CHLORIDE 0.9 % IV BOLUS
1000.0000 mL | Freq: Once | INTRAVENOUS | Status: AC
  Administered 2019-10-14: 1000 mL via INTRAVENOUS

## 2019-10-14 MED ORDER — PROMETHAZINE HCL 25 MG PO TABS
25.0000 mg | ORAL_TABLET | Freq: Four times a day (QID) | ORAL | 0 refills | Status: DC | PRN
Start: 2019-10-14 — End: 2019-12-05

## 2019-10-14 MED ORDER — PROMETHAZINE HCL 25 MG RE SUPP
25.0000 mg | Freq: Four times a day (QID) | RECTAL | 0 refills | Status: DC | PRN
Start: 2019-10-14 — End: 2020-01-22

## 2019-10-14 MED ORDER — PROMETHAZINE HCL 25 MG/ML IJ SOLN
25.0000 mg | Freq: Once | INTRAMUSCULAR | Status: AC
  Administered 2019-10-14: 25 mg via INTRAVENOUS
  Filled 2019-10-14: qty 1

## 2019-10-14 NOTE — ED Triage Notes (Addendum)
Pt states she has been having nausea and vomiting all week. States she hasn't been able to keep food or fluids down for about 3 days. Pt recently seen here for same and discharged home with Zofran but states it is not working. Pt seen by OB and told to come here. Pt states OB left message in "My Chart" for ED doc to view.

## 2019-10-14 NOTE — Discharge Instructions (Signed)
Take the Diclegis at night and then again during the day.  It may make you sleepy.  Feed your self with small meals.  Follow-up with your OB doctor next week.  Return to the ED with new or worsening symptoms peer

## 2019-10-14 NOTE — ED Notes (Signed)
Pt tolerated PO challenge

## 2019-10-14 NOTE — ED Provider Notes (Signed)
[facility] EMERGENCY DEPARTMENT Provider Note   CSN: [id number] Arrival date & time: 10/14/19  0030     History Chief Complaint  Patient presents with  . Emesis    Jackie Rojas is a 34 y.o. female.  Patient approximately [redacted] weeks pregnant here with nausea and vomiting but has been intractable for the past 2 to 3 days.  States she is able to keep anything down at home no matter what she eats.  Has been vomiting 3-4 times daily as well as having diarrhea.  Was seen in the ED for similar symptoms on June 30.  She is been taking Zofran at home as well as scopolamine patch and likely just without relief.  She called her OB was told to come here.  Denies any fevers.  Denies any chest pain or shortness of breath.  Denies any abdominal pain.  Denies any vaginal bleeding or leakage of fluid.  Has had an ultrasound this pregnancy with confirmed IUP. Is currently wearing a scopolamine patch.  States she is not had any Zofran for several days.  Does take Diclegis at night only.  Has not had any Phenergan  The history is provided by the patient.  Emesis Associated symptoms: diarrhea   Associated symptoms: no abdominal pain, no arthralgias, no fever, no headaches and no myalgias        Past Medical History:  Diagnosis Date  . Gallstones   . Gestational hypertension    PP  . HSV-2 infection     There are no problems to display for this patient.   Past Surgical History:  Procedure Laterality Date  . TONSILLECTOMY       OB History    Gravida  5   Para  3   Term  3   Preterm      AB  1   Living  3     SAB      TAB      Ectopic      Multiple      Live Births              History reviewed. No pertinent family history.  Social History   Tobacco Use  . Smoking status: Never Smoker  . Smokeless tobacco: Never Used  Vaping Use  . Vaping Use: Never used  Substance Use Topics  . Alcohol use: Not Currently  . Drug use: Never    Home Medications Prior to  Admission medications   Medication Sig Start Date End Date Taking? Authorizing Provider  aspirin-acetaminophen-caffeine (EXCEDRIN MIGRAINE) [phone] MG tablet Take 2 tablets by mouth every 6 (six) hours as needed.     [provider]  Doxylamine-Pyridoxine (DICLEGIS) 10-10 MG TBEC Take 2 at hs and 1 in am and 1 in afternoon, no more than 4 a day 09/18/19   Adline Potter, NP  fluticasone (FLONASE) 50 MCG/ACT nasal spray Place 1 spray into both nostrils daily as needed for allergies or rhinitis.    [provider]  Hyoscyamine Sulfate SL (LEVSIN/SL) 0.125 MG SUBL Place 1 tablet under the tongue every 12 (twelve) hours as needed (gas cramps). 09/19/19   Aviva Signs, CNM  ondansetron (ZOFRAN ODT) 4 MG disintegrating tablet Take 1 tablet (4 mg total) by mouth every 8 (eight) hours as needed for nausea or vomiting. 10/03/19   Bill Salinas, PA-C  polycarbophil (FIBERCON) 625 MG tablet Take 1 tablet (625 mg total) by mouth daily. 09/19/19   Aviva Signs, CNM  scopolamine (TRANSDERM-SCOP) 1 MG/3DAYS Place 1 patch (1.5 mg total) onto the skin every 3 (three) days. 09/19/19   Aviva Signs, CNM  simethicone (GAS-X) 80 MG chewable tablet Chew 1 tablet (80 mg total) by mouth every 6 (six) hours as needed for flatulence. 09/19/19   Aviva Signs, CNM  valACYclovir (VALTREX) 500 MG tablet Take 2,000 mg by mouth daily as needed.    [provider]    Allergies    Patient has no known allergies.  Review of Systems   Review of Systems  Constitutional: Positive for activity change and appetite change. Negative for fever.  HENT: Negative for congestion and rhinorrhea.   Eyes: Negative for visual disturbance.  Respiratory: Negative for chest tightness and shortness of breath.   Gastrointestinal: Positive for diarrhea, nausea and vomiting. Negative for abdominal pain.  Genitourinary: Negative for dysuria and hematuria.  Musculoskeletal: Negative for arthralgias  and myalgias.  Neurological: Negative for dizziness, weakness and headaches.   all other systems are negative except as noted in the HPI and PMH.    Physical Exam Updated Vital Signs BP (!) 142/96 (BP Location: Right Arm)   Pulse 93   Temp 98.9 F (37.2 C) (Oral)   Resp 18   Ht 5\' 6"  (1.676 m)   Wt 130.6 kg   LMP 07/17/2019 (Approximate)   SpO2 100%   BMI 46.48 kg/m   Physical Exam Vitals and nursing note reviewed.  Constitutional:      General: She is not in acute distress.    Appearance: She is well-developed. She is obese.  HENT:     Head: Normocephalic and atraumatic.     Mouth/Throat:     Pharynx: No oropharyngeal exudate.  Eyes:     Conjunctiva/sclera: Conjunctivae normal.     Pupils: Pupils are equal, round, and reactive to light.  Neck:     Comments: No meningismus. Cardiovascular:     Rate and Rhythm: Normal rate and regular rhythm.     Heart sounds: Normal heart sounds. No murmur heard.   Pulmonary:     Effort: Pulmonary effort is normal. No respiratory distress.     Breath sounds: Normal breath sounds.  Abdominal:     Palpations: Abdomen is soft.     Tenderness: There is no abdominal tenderness. There is no guarding or rebound.  Musculoskeletal:        General: No tenderness. Normal range of motion.     Cervical back: Normal range of motion and neck supple.  Skin:    General: Skin is warm.  Neurological:     Mental Status: She is alert and oriented to person, place, and time.     Cranial Nerves: No cranial nerve deficit.     Motor: No abnormal muscle tone.     Coordination: Coordination normal.     Comments:  5/5 strength throughout. CN 2-12 intact.Equal grip strength.   Psychiatric:        Behavior: Behavior normal.     ED Results / Procedures / Treatments   Labs (all labs ordered are listed, but only abnormal results are displayed) Labs Reviewed  CBC WITH DIFFERENTIAL/PLATELET - Abnormal; Notable for the following components:      Result  Value   Hemoglobin 11.4 (*)    MCH 25.5 (*)    RDW 16.6 (*)    All other components within normal limits  COMPREHENSIVE METABOLIC PANEL - Abnormal; Notable for the following components:   Sodium 134 (*)  Calcium 8.4 (*)    Albumin 2.8 (*)    AST 14 (*)    Total Bilirubin 0.2 (*)    All other components within normal limits  URINALYSIS, ROUTINE W REFLEX MICROSCOPIC - Abnormal; Notable for the following components:   APPearance HAZY (*)    All other components within normal limits  LIPASE, BLOOD    EKG None  Radiology No results found.  Procedures Ultrasound ED OB Pelvic  Date/Time: 10/14/2019 2:02 AM Performed by: Glynn Octave, MD Authorized by: Glynn Octave, MD   Procedure details:    Indications: evaluate for IUP     Assess:  Fetal viability and intrauterine pregnancy   Technique:  Transabdominal obstetric (HCG+) exam   Images: archived   Study Limitations: body habitus Uterine findings:    Endometrial stripe: identified     Intrauterine pregnancy: identified     Single gestation: identified     Gestational sac: identified     Yolk sac: identified     Fetal pole: identified     Fetal heart rate: identified      Left ovary findings:    Left ovary:  Not visualized    Right ovary findings:     Right ovary:  Not visualized    Other findings:    Free pelvic fluid: not identified     Free peritoneal fluid: not identified   Comments:     FHR present but difficult to measure. +movement.   (including critical care time)  Medications Ordered in ED Medications  sodium chloride 0.9 % bolus 1,000 mL (has no administration in time range)  promethazine (PHENERGAN) injection 25 mg (has no administration in time range)    ED Course  I have reviewed the triage vital signs and the nursing notes.  Pertinent labs & imaging results that were available during my care of the patient were reviewed by me and considered in my medical decision making (see chart for  details).    MDM Rules/Calculators/A&P                         Nausea and vomiting in setting of pregnancy.  No abdominal pain.  No vaginal bleeding.  Bedside ultrasound confirms IUP.  Fetal heart rate difficult to measure but is present  Labs are reassuring.  Patient given IV fluids and Phenergan.  No ketones in urine.  No vomiting throughout ED course.  No abdominal pain.  No fever.  States her nausea is improved.  Blood pressure has normalized.  Discussed starting Phenergan p.o. as well as suppositories.  Continue Diclegis.  Follow-up with her OB this week.  Advise discontinuing Zofran as it is not recommended early pregnancy. Return precautions discussed.  Final Clinical Impression(s) / ED Diagnoses Final diagnoses:  Nausea and vomiting in pregnancy prior to [redacted] weeks gestation    Rx / DC Orders ED Discharge Orders    None       Jameela Michna, Jeannett Senior, MD 10/14/19 [phone]

## 2019-10-23 ENCOUNTER — Telehealth: Payer: Self-pay | Admitting: Women's Health

## 2019-10-23 NOTE — Telephone Encounter (Signed)
Patient is having vaginal cramping, and on a scale from 0-10 she said it was about 4.

## 2019-10-23 NOTE — Telephone Encounter (Signed)
Telephoned patient at home number. Patient states no longer having the vaginal cramping since she hydrated. Advised patient to keep appointment as scheduled. Patient voiced understanding.

## 2019-10-25 ENCOUNTER — Other Ambulatory Visit: Payer: Self-pay | Admitting: Obstetrics and Gynecology

## 2019-10-25 DIAGNOSIS — Z3682 Encounter for antenatal screening for nuchal translucency: Secondary | ICD-10-CM

## 2019-10-26 ENCOUNTER — Ambulatory Visit (INDEPENDENT_AMBULATORY_CARE_PROVIDER_SITE_OTHER): Payer: BC Managed Care – PPO

## 2019-10-26 ENCOUNTER — Ambulatory Visit (INDEPENDENT_AMBULATORY_CARE_PROVIDER_SITE_OTHER): Payer: BC Managed Care – PPO | Admitting: Women's Health

## 2019-10-26 ENCOUNTER — Ambulatory Visit: Payer: BC Managed Care – PPO | Admitting: *Deleted

## 2019-10-26 ENCOUNTER — Encounter: Payer: Self-pay | Admitting: Women's Health

## 2019-10-26 VITALS — BP 134/95 | HR 98 | Wt 305.0 lb

## 2019-10-26 DIAGNOSIS — Z8759 Personal history of other complications of pregnancy, childbirth and the puerperium: Secondary | ICD-10-CM | POA: Diagnosis not present

## 2019-10-26 DIAGNOSIS — Z3490 Encounter for supervision of normal pregnancy, unspecified, unspecified trimester: Secondary | ICD-10-CM | POA: Insufficient documentation

## 2019-10-26 DIAGNOSIS — Z3481 Encounter for supervision of other normal pregnancy, first trimester: Secondary | ICD-10-CM | POA: Diagnosis not present

## 2019-10-26 DIAGNOSIS — Z3A12 12 weeks gestation of pregnancy: Secondary | ICD-10-CM

## 2019-10-26 DIAGNOSIS — O0990 Supervision of high risk pregnancy, unspecified, unspecified trimester: Secondary | ICD-10-CM | POA: Insufficient documentation

## 2019-10-26 DIAGNOSIS — Z3682 Encounter for antenatal screening for nuchal translucency: Secondary | ICD-10-CM

## 2019-10-26 DIAGNOSIS — R030 Elevated blood-pressure reading, without diagnosis of hypertension: Secondary | ICD-10-CM | POA: Diagnosis not present

## 2019-10-26 DIAGNOSIS — Z8679 Personal history of other diseases of the circulatory system: Secondary | ICD-10-CM | POA: Diagnosis not present

## 2019-10-26 DIAGNOSIS — Z6841 Body Mass Index (BMI) 40.0 and over, adult: Secondary | ICD-10-CM

## 2019-10-26 DIAGNOSIS — Z3480 Encounter for supervision of other normal pregnancy, unspecified trimester: Secondary | ICD-10-CM

## 2019-10-26 LAB — POCT URINALYSIS DIPSTICK OB
Blood, UA: NEGATIVE
Glucose, UA: NEGATIVE
Ketones, UA: NEGATIVE
Leukocytes, UA: NEGATIVE
Nitrite, UA: NEGATIVE
POC,PROTEIN,UA: NEGATIVE

## 2019-10-26 NOTE — Progress Notes (Signed)
INITIAL OBSTETRICAL VISIT Patient name: Jackie Rojas MRN [id number]  Date of birth: [birth year] Chief Complaint:   Initial Prenatal Visit (nt/it, nausea, excessive gas)  History of Present Illness:   Jackie Rojas is a 34 y.o. [id number] African American female at [redacted]w[redacted]d by [place] at 7 weeks with an Estimated Date of Delivery: 05/08/20 being seen today for her initial obstetrical visit.   Her obstetrical history is significant for SAB x 1, term uncomplicated SVB x 3- had postpartum HTN after 1st baby requiring meds x ~6wks.   Today she reports n/v- seems to be improving some, has multiple meds at home.  Depression screen [facility] 2/9 10/26/2019  Decreased Interest 0  Down, Depressed, Hopeless 0  PHQ - 2 Score 0  Altered sleeping 0  Tired, decreased energy 0  Change in appetite 1  Feeling bad or failure about yourself  0  Trouble concentrating 0  Moving slowly or fidgety/restless 0  Suicidal thoughts 0  PHQ-9 Score 1    Patient's last menstrual period was 07/17/2019 (approximate). Last pap 2021 w/ PCP. Results were: normal Review of Systems:   Pertinent items are noted in HPI Denies cramping/contractions, leakage of fluid, vaginal bleeding, abnormal vaginal discharge w/ itching/odor/irritation, headaches, visual changes, shortness of breath, chest pain, abdominal pain, severe nausea/vomiting, or problems with urination or bowel movements unless otherwise stated above.  Pertinent History Reviewed:  Reviewed past medical,surgical, social, obstetrical and family history.  Reviewed problem list, medications and allergies. OB History  Gravida Para Term Preterm AB Living  5 3 3   1 3   SAB TAB Ectopic Multiple Live Births  1       2    # Outcome Date GA Lbr Len/2nd Weight Sex Delivery Anes PTL Lv  5 Current           4 Term 12/21/16 [redacted]w[redacted]d  6 lb 4 oz (2.835 kg) F Vag-Spont EPI N   3 Term 09/26/14 [redacted]w[redacted]d  6 lb 6 oz (2.892 kg) F Vag-Spont EPI N LIV  2 SAB 2012 [redacted]w[redacted]d         1 Term 09/25/08 [redacted]w[redacted]d  6  lb 7 oz (2.92 kg) F Vag-Spont EPI N LIV   Physical Assessment:   Vitals:   10/26/19 1104  BP: (!) 134/95  Pulse: 98  Weight: (!) 305 lb (138.3 kg)  Body mass index is 49.23 kg/m.       Physical Examination:  General appearance - well appearing, and in no distress  Mental status - alert, oriented to person, place, and time  Psych:  She has a normal mood and affect  Skin - warm and dry, normal color, no suspicious lesions noted  Chest - effort normal, all lung fields clear to auscultation bilaterally  Heart - normal rate and regular rhythm  Abdomen - soft, nontender  Extremities:  No swelling or varicosities noted  Thin prep pap is not done  TODAY'S NT 10/28/19 12+1 wks,measurements c/w dates,crl 54.24 mm,fhr 154 bpm,NB present,NT 1.3 mm,anterior placenta    Results for orders placed or performed in visit on 10/26/19 (from the past 24 hour(s))  POC Urinalysis Dipstick OB   Collection Time: 10/26/19 11:31 AM  Result Value Ref Range   Color, UA     Clarity, UA     Glucose, UA Negative Negative   Bilirubin, UA     Ketones, UA neg    Spec Grav, UA     Blood, UA neg    pH, UA  POC,PROTEIN,UA Negative Negative, Trace, Small (1+), Moderate (2+), Large (3+), 4+   Urobilinogen, UA     Nitrite, UA neg    Leukocytes, UA Negative Negative   Appearance     Odor      Assessment & Plan:  1) Low-Risk Pregnancy [id number] at [redacted]w[redacted]d with an Estimated Date of Delivery: 05/08/20   2) Initial OB visit  3) H/O PP HTN> baseline labs today, ASA 162mg  daily  4) BP mildly elevated>monitor  5) N/V> continue home meds, let know if worsens  Meds: No orders of the defined types were placed in this encounter.   Initial labs obtained Continue prenatal vitamins Reviewed n/v relief measures and warning s/s to report Reviewed recommended weight gain based on pre-gravid BMI Encouraged well-balanced diet Genetic & carrier screening discussed: requests Panorama, NT/IT and Horizon 14   Ultrasound  discussed; fetal survey: requested CCNC completed> form faxed if has or is planning to apply for medicaid The nature of [place] for [employer] with multiple MDs and other Advanced Practice Providers was explained to patient; also emphasized that fellows, residents, and students are part of our team. Does not have home bp cuff. Has applied for mcaid, plans to wait til that goes through so we can send her a cuff through [facility]. Check bp weekly, let [employer] know if >140/90.   Indications for early A1C (per uptodate) BMI >=25 (>=23 in Asian women) AND one of the following High-risk race/ethnicity (eg, African American, Latino, Native American, [place] American, [place]er) Yes   Follow-up: Return in about 3 weeks (around 11/16/2019) for LROB, 2nd IT, in person, CNM.   Orders Placed This Encounter  Procedures  . GC/Chlamydia Probe Amp  . Urine Culture  . Integrated 1  . Genetic Screening  . CBC/D/Plt+RPR+Rh+ABO+Rub Ab...  . Pain Management Screening Profile (10S)  . Hemoglobin A1c  . Comprehensive metabolic panel  . Protein / creatinine ratio, urine  . POC Urinalysis Dipstick OB    11/18/2019 CNM, [facility] 10/26/2019 1:53 PM

## 2019-10-26 NOTE — Patient Instructions (Signed)
Jackie Rojas, I greatly value your feedback.  If you receive a survey following your visit with [place] today, we appreciate you taking the time to fill it out.  Thanks, [name], CNM, WHNP-BC   [facility] ([address] [place], [place] [address]) Entrance C, located off of [address] [employer] valet parking   Begin taking 162mg  (two 81mg  tablets) baby aspirin daily to decrease the risk of preeclampsia during pregnancy    Nausea & Vomiting  Have saltine crackers or pretzels by your bed and eat a few bites before you raise your head out of bed in the morning  Eat small frequent meals throughout the day instead of large meals  Drink plenty of fluids throughout the day to stay hydrated, just don't drink a lot of fluids with your meals.  This can make your stomach fill up faster making you feel sick  Do not brush your teeth right after you eat  Products with real ginger are good for nausea, like ginger ale and ginger hard candy Make sure it says made with real ginger!  Sucking on sour candy like lemon heads is also good for nausea  If your prenatal vitamins make you nauseated, take them at night so you will sleep through the nausea  Sea Bands  If you feel like you need medicine for the nausea & vomiting please let know  If you are unable to keep any fluids or food down please let know   Constipation  Drink plenty of fluid, preferably water, throughout the day  Eat foods high in fiber such as fruits, vegetables, and grains  Exercise, such as walking, is a good way to keep your bowels regular  Drink warm fluids, especially warm prune juice, or decaf coffee  Eat a 1/2 cup of real oatmeal (not instant), 1/2 cup applesauce, and 1/2-1 cup warm prune juice every day  If needed, you may take Colace (docusate sodium) stool softener once or twice a day to help keep the stool soft.   If you still are having problems with constipation, you may take  Miralax once daily as needed to help keep your bowels regular.   Home Blood Pressure Monitoring for Patients   Your provider has recommended that you check your blood pressure (BP) at least once a week at home. If you do not have a blood pressure cuff at home, one will be provided for you. Contact your provider if you have not received your monitor within 1 week.   Helpful Tips for Accurate Home Blood Pressure Checks  . Don't smoke, exercise, or drink caffeine 30 minutes before checking your BP . Use the restroom before checking your BP (a full bladder can raise your pressure) . Relax in a comfortable upright chair . Feet on the ground . Left arm resting comfortably on a flat surface at the level of your heart . Legs uncrossed . Back supported . Sit quietly and don't talk . Place the cuff on your bare arm . Adjust snuggly, so that only two fingertips can fit between your skin and the top of the cuff . Check 2 readings separated by at least one minute . Keep a log of your BP readings . For a visual, please reference this diagram: http://ccnc.care/bpdiagram  Provider Name: [facility]     Phone: [phone]  Zone 1: ALL CLEAR  Continue to monitor your symptoms:  . BP reading is less than 140 (  top number) or less than 90 (bottom number)  . No right upper stomach pain . No headaches or seeing spots . No feeling nauseated or throwing up . No swelling in face and hands  Zone 2: CAUTION Call your doctor's office for any of the following:  . BP reading is greater than 140 (top number) or greater than 90 (bottom number)  . Stomach pain under your ribs in the middle or right side . Headaches or seeing spots . Feeling nauseated or throwing up . Swelling in face and hands  Zone 3: EMERGENCY  Seek immediate medical care if you have any of the following:  . BP reading is greater than160 (top number) or greater than 110 (bottom number) . Severe headaches not improving with  Tylenol . Serious difficulty catching your breath . Any worsening symptoms from Zone 2    First Trimester of Pregnancy The first trimester of pregnancy is from week 1 until the end of week 12 (months 1 through 3). A week after a sperm fertilizes an egg, the egg will implant on the wall of the uterus. This embryo will begin to develop into a baby. Genes from you and your partner are forming the baby. The female genes determine whether the baby is a boy or a girl. At 6-8 weeks, the eyes and face are formed, and the heartbeat can be seen on ultrasound. At the end of 12 weeks, all the baby's organs are formed.  Now that you are pregnant, you will want to do everything you can to have a healthy baby. Two of the most important things are to get good prenatal care and to follow your health care provider's instructions. Prenatal care is all the medical care you receive before the baby's birth. This care will help prevent, find, and treat any problems during the pregnancy and childbirth. BODY CHANGES Your body goes through many changes during pregnancy. The changes vary from woman to woman.   You may gain or lose a couple of pounds at first.  You may feel sick to your stomach (nauseous) and throw up (vomit). If the vomiting is uncontrollable, call your health care provider.  You may tire easily.  You may develop headaches that can be relieved by medicines approved by your health care provider.  You may urinate more often. Painful urination may mean you have a bladder infection.  You may develop heartburn as a result of your pregnancy.  You may develop constipation because certain hormones are causing the muscles that push waste through your intestines to slow down.  You may develop hemorrhoids or swollen, bulging veins (varicose veins).  Your breasts may begin to grow larger and become tender. Your nipples may stick out more, and the tissue that surrounds them (areola) may become darker.  Your gums  may bleed and may be sensitive to brushing and flossing.  Dark spots or blotches (chloasma, mask of pregnancy) may develop on your face. This will likely fade after the baby is born.  Your menstrual periods will stop.  You may have a loss of appetite.  You may develop cravings for certain kinds of food.  You may have changes in your emotions from day to day, such as being excited to be pregnant or being concerned that something may go wrong with the pregnancy and baby.  You may have more vivid and strange dreams.  You may have changes in your hair. These can include thickening of your hair, rapid growth, and changes in  texture. Some women also have hair loss during or after pregnancy, or hair that feels dry or thin. Your hair will most likely return to normal after your baby is born. WHAT TO EXPECT AT YOUR PRENATAL VISITS During a routine prenatal visit:  You will be weighed to make sure you and the baby are growing normally.  Your blood pressure will be taken.  Your abdomen will be measured to track your baby's growth.  The fetal heartbeat will be listened to starting around week 10 or 12 of your pregnancy.  Test results from any previous visits will be discussed. Your health care provider may ask you:  How you are feeling.  If you are feeling the baby move.  If you have had any abnormal symptoms, such as leaking fluid, bleeding, severe headaches, or abdominal cramping.  If you have any questions. Other tests that may be performed during your first trimester include:  Blood tests to find your blood type and to check for the presence of any previous infections. They will also be used to check for low iron levels (anemia) and Rh antibodies. Later in the pregnancy, blood tests for diabetes will be done along with other tests if problems develop.  Urine tests to check for infections, diabetes, or protein in the urine.  An ultrasound to confirm the proper growth and development  of the baby.  An amniocentesis to check for possible genetic problems.  Fetal screens for spina bifida and Down syndrome.  You may need other tests to make sure you and the baby are doing well. HOME CARE INSTRUCTIONS  Medicines  Follow your health care provider's instructions regarding medicine use. Specific medicines may be either safe or unsafe to take during pregnancy.  Take your prenatal vitamins as directed.  If you develop constipation, try taking a stool softener if your health care provider approves. Diet  Eat regular, well-balanced meals. Choose a variety of foods, such as meat or vegetable-based protein, fish, milk and low-fat dairy products, vegetables, fruits, and whole grain breads and cereals. Your health care provider will help you determine the amount of weight gain that is right for you.  Avoid raw meat and uncooked cheese. These carry germs that can cause birth defects in the baby.  Eating four or five small meals rather than three large meals a day may help relieve nausea and vomiting. If you start to feel nauseous, eating a few soda crackers can be helpful. Drinking liquids between meals instead of during meals also seems to help nausea and vomiting.  If you develop constipation, eat more high-fiber foods, such as fresh vegetables or fruit and whole grains. Drink enough fluids to keep your urine clear or pale yellow. Activity and Exercise  Exercise only as directed by your health care provider. Exercising will help you:  Control your weight.  Stay in shape.  Be prepared for labor and delivery.  Experiencing pain or cramping in the lower abdomen or low back is a good sign that you should stop exercising. Check with your health care provider before continuing normal exercises.  Try to avoid standing for long periods of time. Move your legs often if you must stand in one place for a long time.  Avoid heavy lifting.  Wear low-heeled shoes, and practice good  posture.  You may continue to have sex unless your health care provider directs you otherwise. Relief of Pain or Discomfort  Wear a good support bra for breast tenderness.    Take warm  sitz baths to soothe any pain or discomfort caused by hemorrhoids. Use hemorrhoid cream if your health care provider approves.    Rest with your legs elevated if you have leg cramps or low back pain.  If you develop varicose veins in your legs, wear support hose. Elevate your feet for 15 minutes, 3-4 times a day. Limit salt in your diet. Prenatal Care  Schedule your prenatal visits by the twelfth week of pregnancy. They are usually scheduled monthly at first, then more often in the last 2 months before delivery.  Write down your questions. Take them to your prenatal visits.  Keep all your prenatal visits as directed by your health care provider. Safety  Wear your seat belt at all times when driving.  Make a list of emergency phone numbers, including numbers for family, friends, the hospital, and police and fire departments. General Tips  Ask your health care provider for a referral to a local prenatal education class. Begin classes no later than at the beginning of month 6 of your pregnancy.  Ask for help if you have counseling or nutritional needs during pregnancy. Your health care provider can offer advice or refer you to specialists for help with various needs.  Do not use hot tubs, steam rooms, or saunas.  Do not douche or use tampons or scented sanitary pads.  Do not cross your legs for long periods of time.  Avoid cat litter boxes and soil used by cats. These carry germs that can cause birth defects in the baby and possibly loss of the fetus by miscarriage or stillbirth.  Avoid all smoking, herbs, alcohol, and medicines not prescribed by your health care provider. Chemicals in these affect the formation and growth of the baby.  Schedule a dentist appointment. At home, brush your teeth with  a soft toothbrush and be gentle when you floss. SEEK MEDICAL CARE IF:   You have dizziness.  You have mild pelvic cramps, pelvic pressure, or nagging pain in the abdominal area.  You have persistent nausea, vomiting, or diarrhea.  You have a bad smelling vaginal discharge.  You have pain with urination.  You notice increased swelling in your face, hands, legs, or ankles. SEEK IMMEDIATE MEDICAL CARE IF:   You have a fever.  You are leaking fluid from your vagina.  You have spotting or bleeding from your vagina.  You have severe abdominal cramping or pain.  You have rapid weight gain or loss.  You vomit blood or material that looks like coffee grounds.  You are exposed to [place] measles and have never had them.  You are exposed to fifth disease or chickenpox.  You develop a severe headache.  You have shortness of breath.  You have any kind of trauma, such as from a fall or a car accident. Document Released: 03/16/2001 Document Revised: 08/06/2013 Document Reviewed: 01/30/2013 [facility] Patient Information 2015 [place], [place]. This information is not intended to replace advice given to you by your health care provider. Make sure you discuss any questions you have with your health care provider.

## 2019-10-26 NOTE — Progress Notes (Signed)
[place] 12+1 wks,measurements c/w dates,crl 54.24 mm,fhr 154 bpm,NB present,NT 1.3 mm,anterior placenta

## 2019-10-28 LAB — URINE CULTURE

## 2019-10-28 LAB — MED LIST OPTION NOT SELECTED

## 2019-10-29 LAB — COMPREHENSIVE METABOLIC PANEL
ALT: 11 IU/L (ref 0–32)
AST: 10 IU/L (ref 0–40)
Albumin/Globulin Ratio: 1 — ABNORMAL LOW (ref 1.2–2.2)
Albumin: 3.6 g/dL — ABNORMAL LOW (ref 3.8–4.8)
Alkaline Phosphatase: 58 IU/L (ref 48–121)
BUN/Creatinine Ratio: 15 (ref 9–23)
BUN: 9 mg/dL (ref 6–20)
Bilirubin Total: <0.2 mg/dL (ref 0.0–1.2)
CO2: 23 mmol/L (ref 20–29)
Calcium: 9.4 mg/dL (ref 8.7–10.2)
Chloride: 100 mmol/L (ref 96–106)
Creatinine, Ser: 0.62 mg/dL (ref 0.57–1.00)
GFR calc Af Amer: 134 mL/min/{1.73_m2} (ref >59)
GFR calc non Af Amer: 116 mL/min/{1.73_m2} (ref >59)
Globulin, Total: 3.6 g/dL (ref 1.5–4.5)
Glucose: 73 mg/dL (ref 65–99)
Potassium: 4.4 mmol/L (ref 3.5–5.2)
Sodium: 136 mmol/L (ref 134–144)
Total Protein: 7.2 g/dL (ref 6.0–8.5)

## 2019-10-29 LAB — CBC/D/PLT+RPR+RH+ABO+RUB AB...
Antibody Screen: NEGATIVE
Basophils Absolute: 0 10*3/uL (ref 0.0–0.2)
Basos: 0 %
EOS (ABSOLUTE): 0.1 10*3/uL (ref 0.0–0.4)
Eos: 1 %
HCV Ab: <0.1 s/co ratio (ref 0.0–0.9)
HIV Screen 4th Generation wRfx: NONREACTIVE
Hematocrit: 39.5 % (ref 34.0–46.6)
Hemoglobin: 12.6 g/dL (ref 11.1–15.9)
Hepatitis B Surface Ag: NEGATIVE
Immature Grans (Abs): 0 10*3/uL (ref 0.0–0.1)
Immature Granulocytes: 0 %
Lymphocytes Absolute: 2.9 10*3/uL (ref 0.7–3.1)
Lymphs: 29 %
MCH: 25.5 pg — ABNORMAL LOW (ref 26.6–33.0)
MCHC: 31.9 g/dL (ref 31.5–35.7)
MCV: 80 fL (ref 79–97)
Monocytes Absolute: 1 10*3/uL — ABNORMAL HIGH (ref 0.1–0.9)
Monocytes: 10 %
Neutrophils Absolute: 5.9 10*3/uL (ref 1.4–7.0)
Neutrophils: 60 %
Platelets: 255 10*3/uL (ref 150–450)
RBC: 4.94 x10E6/uL (ref 3.77–5.28)
RDW: 14.9 % (ref 11.7–15.4)
RPR Ser Ql: NONREACTIVE
Rh Factor: POSITIVE
Rubella Antibodies, IGG: 1.66 index (ref >0.99)
WBC: 9.9 10*3/uL (ref 3.4–10.8)

## 2019-10-29 LAB — PROTEIN / CREATININE RATIO, URINE
Creatinine, Urine: 162.6 mg/dL
Protein, Ur: 12 mg/dL
Protein/Creat Ratio: 74 mg/g creat (ref 0–200)

## 2019-10-29 LAB — GC/CHLAMYDIA PROBE AMP
Chlamydia trachomatis, NAA: NEGATIVE
Neisseria Gonorrhoeae by PCR: NEGATIVE

## 2019-10-29 LAB — PMP SCREEN PROFILE (10S), URINE
Amphetamine Scrn, Ur: NEGATIVE ng/mL
BARBITURATE SCREEN URINE: NEGATIVE ng/mL
BENZODIAZEPINE SCREEN, URINE: NEGATIVE ng/mL
CANNABINOIDS UR QL SCN: NEGATIVE ng/mL
Cocaine (Metab) Scrn, Ur: NEGATIVE ng/mL
Creatinine(Crt), U: 177.4 mg/dL (ref 20.0–300.0)
Methadone Screen, Urine: NEGATIVE ng/mL
OXYCODONE+OXYMORPHONE UR QL SCN: NEGATIVE ng/mL
Opiate Scrn, Ur: NEGATIVE ng/mL
Ph of Urine: 5.9 (ref 4.5–8.9)
Phencyclidine Qn, Ur: NEGATIVE ng/mL
Propoxyphene Scrn, Ur: NEGATIVE ng/mL

## 2019-10-29 LAB — HCV INTERPRETATION

## 2019-10-29 LAB — HEMOGLOBIN A1C
Est. average glucose Bld gHb Est-mCnc: 120 mg/dL
Hgb A1c MFr Bld: 5.8 % — ABNORMAL HIGH (ref 4.8–5.6)

## 2019-10-29 LAB — INTEGRATED 1
Crown Rump Length: 54.2 mm
Gest. Age on Collection Date: 11.9 weeks
Maternal Age at EDD: 36.9 yr
Nuchal Translucency (NT): 1.3 mm
Number of Fetuses: 1
PAPP-A Value: 191.7 ng/mL
Weight: 305 [lb_av]

## 2019-10-31 ENCOUNTER — Other Ambulatory Visit: Payer: Self-pay | Admitting: *Deleted

## 2019-10-31 DIAGNOSIS — O0990 Supervision of high risk pregnancy, unspecified, unspecified trimester: Secondary | ICD-10-CM

## 2019-11-04 DIAGNOSIS — Z419 Encounter for procedure for purposes other than remedying health state, unspecified: Secondary | ICD-10-CM | POA: Diagnosis not present

## 2019-11-07 ENCOUNTER — Encounter: Payer: Self-pay | Admitting: *Deleted

## 2019-11-07 ENCOUNTER — Encounter: Payer: Self-pay | Admitting: Women's Health

## 2019-11-07 DIAGNOSIS — Z3480 Encounter for supervision of other normal pregnancy, unspecified trimester: Secondary | ICD-10-CM

## 2019-11-07 DIAGNOSIS — O285 Abnormal chromosomal and genetic finding on antenatal screening of mother: Secondary | ICD-10-CM | POA: Insufficient documentation

## 2019-11-15 ENCOUNTER — Encounter: Payer: Self-pay | Admitting: Obstetrics and Gynecology

## 2019-11-15 ENCOUNTER — Ambulatory Visit (INDEPENDENT_AMBULATORY_CARE_PROVIDER_SITE_OTHER): Payer: BC Managed Care – PPO | Admitting: Obstetrics and Gynecology

## 2019-11-15 VITALS — BP 162/97 | HR 110 | Wt 313.6 lb

## 2019-11-15 DIAGNOSIS — Z1389 Encounter for screening for other disorder: Secondary | ICD-10-CM

## 2019-11-15 DIAGNOSIS — Z3A15 15 weeks gestation of pregnancy: Secondary | ICD-10-CM

## 2019-11-15 DIAGNOSIS — O10912 Unspecified pre-existing hypertension complicating pregnancy, second trimester: Secondary | ICD-10-CM

## 2019-11-15 DIAGNOSIS — I10 Essential (primary) hypertension: Secondary | ICD-10-CM | POA: Insufficient documentation

## 2019-11-15 DIAGNOSIS — O119 Pre-existing hypertension with pre-eclampsia, unspecified trimester: Secondary | ICD-10-CM | POA: Insufficient documentation

## 2019-11-15 DIAGNOSIS — O10919 Unspecified pre-existing hypertension complicating pregnancy, unspecified trimester: Secondary | ICD-10-CM

## 2019-11-15 DIAGNOSIS — Z3480 Encounter for supervision of other normal pregnancy, unspecified trimester: Secondary | ICD-10-CM

## 2019-11-15 DIAGNOSIS — Z331 Pregnant state, incidental: Secondary | ICD-10-CM

## 2019-11-15 LAB — POCT URINALYSIS DIPSTICK OB
Blood, UA: NEGATIVE
Glucose, UA: NEGATIVE
Ketones, UA: NEGATIVE
Leukocytes, UA: NEGATIVE
Nitrite, UA: NEGATIVE
POC,PROTEIN,UA: NEGATIVE

## 2019-11-15 MED ORDER — LABETALOL HCL 200 MG PO TABS: 200.0000 mg | ORAL_TABLET | Freq: Two times a day (BID) | ORAL | 3 refills | Status: DC

## 2019-11-15 MED ORDER — BLOOD PRESSURE CUFF MISC
1.0000 | 0 refills | Status: AC
Start: 2019-11-15 — End: ?

## 2019-11-15 NOTE — Patient Instructions (Signed)
Weight loss guidelines and tips ° ° °1. Utilize measuring cups and spoons (found at a low price at [employer]) to monitor and manage serving sizes. Use them to carefully measure out serving sizes listed on food packaging to prevent overeating and better manage caloric intake. Most of us tell ourselves lies about how much a serving really is. For example, a serving of meat is actually 4 oz, which is the size of a deck of cards!!!  ° °2. Utilize smart phone apps like "My Net Diary", "My Fitness Pal", "Lose It", or "Eat Better" to keep track of calorie intake and exercise. Also consider purchasing a pedometer to keep track of daily steps. Some smart phones have built in pedometers to keep track of step counts (your goal is 10,000 steps per day).  ° °3. Make sure you are consuming enough water daily. Drink 8 oz prior to meals, or at snack time, to cut down on overeating.  ° °4. Consider exercise programs. [employer] offers water aerobics classes that are low-impact to minimize joint pains while still burning calories. For maximum impact at home, do small exercises while watching TV during commercial breaks.  ° °The Truth Table:   °3,500 calories = 1 pound of fat  °Minus 500 calories a day x 7 days will lose you 1 pound  °Exercise helps, but one mile walk = 200 calories burned   °10,000 steps is your goal for the day, that's 4-5 miles!  ° °*Please cut out and put on your refrigerator*  ° °PLEASE GO READ THESE VERY SIMPLE ARTICLES ON THE BENEFITS OF WEIGHT LOSS, RELEASING TOXINS, AND THE OTHER THINGS FAT CELLS "HOARD" ° °https://getpocket.com/explore/item/when-you-lose-weight-your-fat-cells-don-t-just-let-go-of-fat?utm_source=pocket-newtab ° °https://getpocket.com/explore/item/ten-habits-of-people-who-lose-weight-and-keep-it-off?utm_source=pocket-newtab ° ° ° °ITrackBites app for weight loss management  °Patient Education  ° °Weight Loss Diet  °About this topic  °There are many "trendy" weight loss diets that are popular  today. Many of these diets can end up being more harmful than helpful. The healthiest way to lose weight is to burn more calories than you eat.  °A weight loss diet should help you have a healthy view of eating. It is NOT healthy to stop eating to try and lose weight. A good diet plan will help you cut down your food intake and make healthy choices.  °A healthy weight loss goal is 1 to 2 pounds (0.5 to 1 kg) per week. It takes 3500 calories to lose 1 pound (0.5 kg). That means cutting out 500 calories every day for 7 days. You can lose these 500 calories per day by eating fewer calories, burning them through exercise, or both.  °It may be easier than you think to cut 500 calories from your daily intake. You can:  °· Switch from whole milk to 1% or skim milk  °· Switch from regular cheese to fat-free cheese  °· Use healthier condiment choices  °? Fat-free sour cream or salad dressings  °? Spray butter  °? Diet syrups or jellies over regular  °· Try frozen yogurt as a dessert rather than eating ice cream.  °· Skip the chips. Snack on carrots, vegetables, or fruit. If chips are a favorite of yours, try the baked style.  °· Eat boiled, seared fish or skinless chicken rather than red meat.  °· Try flavored no-calorie waters. Do not drink soda and juices that have many calories.  °General  °· Eating smaller meals more often may be helpful. This will help keep your metabolism high and your hunger low. This will   keep you from overeating at your next meal. Also, eating meals slowly helps you feel full faster.  °? If eating 3 meals is a part of your lifestyle, choose more low-fat proteins and higher fiber to fill you up at each meal. Skip the snack at night if you can. Drink a calorie-free beverage instead.  °· Do not skip meals. Most often if you skip a meal, you eat too much at the next meal.  °· Eat smaller portions. Use a smaller plate or bowl for meals, and when you are eating out, eat half and take the rest home.  °·  Plan ahead. Plan your meals and grocery list before going to the store. Planning will keep you from getting meals from fast foods or restaurants.  °· Do not go to the grocery store hungry. You are more likely to buy snacks that are not good for you.  °· Portion out snacks. When you are having a snack, instead of grabbing the whole bag, portion a small amount out to give yourself a stopping point.  °· Drink water before and after your meals to help fill you up without the calories.  °· When eating starchy foods, choose whole-grain products. These have a lot of fiber which will make you feel full. Fiber also helps lower cholesterol and helps with bowel function.  °· If you need a helpful start, ask your doctor to send you to a dietitian for weight loss help.  ° ° °What will the results be?  °Losing excess weight will make your whole body healthier. You will have more energy for your daily activities and lower your risk for health problems.  °What lifestyle changes are needed?  °Stay active. Eating healthy is not always enough to lose weight. Burning calories by exercising is a big part of weight loss.  °What foods are good to eat?  °The key is to watch your portion sizes. It is best to choose foods that are lower in fat and calories.  °· Choose low-fat meats:  °? Boneless chicken breast  °? Pork loin  °? 90% lean beef  °? Lean turkey meat  °? Fresh fish (not fried)  °· Choose low-fat dairy products:  °? 1% or skim milk  °? Spray butter or margarine  °? Low-fat or fat-free cheese  °? Frozen yogurt or low calorie ice cream  °· Choose fresh fruits, green vegetables, beans and lentils, and whole wheat products more often.  °· Choose smart snacks:  °? Baked chips  °? Pretzels  °? Popcorn with no butter ? use pepper, garlic, or another spice to taste  °? High fiber, low-fat crackers  °? Reduced fat cookies  °? Diet or no-calorie beverages  °What foods should be limited or avoided?  °Limit high-fat, high-sodium, and  high-calorie foods like:  °· Fried foods  °· Processed meats  °· Whole-fat dairy products  °· Candy, cookies, chips, pastries  °· Sausage, bacon, any full-fat meats  °· Soda, juice  °· Beer, wine, and mixed drinks (alcohol)  °Will there be any other care needed?  °What do I do first before trying to lose weight?  °· Talk to your doctor and dietitian to see if you need to lose weight. Work with them to set your weight loss goals.  °· If you have a chronic illness, such as high blood sugar or high blood pressure, ask a doctor or dietitian what diet and exercise is right for you.  °· Ask your doctor   about how much you are able to exercise and what type of exercise is good for you.  °Helpful tips  °· Keep a food journal to help keep you on track.  °· Do not eat 2 hours before bedtime.  °· Join a support group.  °Tips for burning calories:  °· If your workplace is near your house, choose to walk or bike to work instead of driving.  °· Take 20-minute walks each day. Walk around during your lunch break. You will not only burn calories, but raise your energy for the rest of the day.  °· Take the stairs over the elevators.  °· Join a gym or exercise class with a friend.  °· Try to exercise 30 minutes a day. Three 10 minute sessions works too.  °· Drink lots of water before, during, and after exercise.  °Where can I learn more?  °ChooseMyPlate.gov  °https://www.choosemyplate.gov/ten-tips-make-better-food-choices  °ChooseMyPlate.gov  °https://www.choosemyplate.gov/ten-tips-get-the-facts-to-look-and-feel-better  °Weight-Control Information Network  °http://win.niddk.nih.gov/publications/for_life.htm  °Weight-Control Information Network  °http://www.win.niddk.nih.gov/publications/better_health.htm  °Last Reviewed Date  °2014-12-31  °Consumer Information Use and Disclaimer  °This information is not specific medical advice and does not replace information you receive from your health care provider. This is only a brief summary of  general information. It does NOT include all information about conditions, illnesses, injuries, tests, procedures, treatments, therapies, discharge instructions or life-style choices that may apply to you. You must talk with your health care provider for complete information about your health and treatment options. This information should not be used to decide whether or not to accept your health care provider's advice, instructions or recommendations. Only your health care provider has the knowledge and training to provide advice that is right for you.  °Copyright  °Copyright © 2019 [employer] and its affiliates and/or licensors. All rights reserved.  ° °

## 2019-11-15 NOTE — Progress Notes (Signed)
Patient ID: Jackie Rojas, female   DOB: 1985/08/28, 34 y.o.   MRN: [id number]   [facility] PREGNANCY VISIT Patient name: Jackie Rojas MRN [id number]  Date of birth: [birth year] Chief Complaint:   Routine Prenatal Visit  History of Present Illness:   Jackie Rojas is a 34 y.o. [id number] female at [redacted]w[redacted]d with an Estimated Date of Delivery: 05/08/20 being seen today for ongoing management of a HIGH-risk pregnancy complicated by [facility]. Today she reports weight gain and nipple soreness. She states that she usually doesn't gain weight until her third trimester. She weighs about 30 lbs more than she did at the beginning of her last pregnancy.  The patient had postpartum HTN a few weeks after her first pregnancy. She does not have a BP cuff at home but a family member has one, will order..  The patient is a single mother and states that she is dealing with a lot of emotions right now.   . Vag. Bleeding: None.   . denies leaking of fluid. Review of Systems:   Pertinent items are noted in HPI Denies abnormal vaginal discharge w/ itching/odor/irritation, headaches, visual changes, shortness of breath, chest pain, abdominal pain, severe nausea/vomiting, or problems with urination or bowel movements unless otherwise stated above. Pertinent History Reviewed:  Reviewed past medical,surgical, social, obstetrical and family history.  Reviewed problem list, medications and allergies. Physical Assessment:   Vitals:   11/15/19 1517 11/15/19 1518  BP: (!) 143/99 (!) 162/97  Pulse: (!) 110   Weight: (!) 313 lb 9.6 oz (142.2 kg)   Body mass index is 50.62 kg/m.        Physical Examination:   General appearance: Well appearing, and in no distress  Mental status: Alert, oriented to person, place, and time  Skin: Warm & dry  Cardiovascular: Normal heart rate noted  Respiratory: Normal respiratory effort, no distress  Abdomen: Soft, gravid, nontender  Pelvic: Cervical exam deferred         Extremities:  Edema: None  Fetal Status:          Results for orders placed or performed in visit on 11/15/19 (from the past 24 hour(s))  POC Urinalysis Dipstick OB   Collection Time: 11/15/19  3:20 PM  Result Value Ref Range   Color, UA     Clarity, UA     Glucose, UA Negative Negative   Bilirubin, UA     Ketones, UA neg    Spec Grav, UA     Blood, UA neg    pH, UA     POC,PROTEIN,UA Negative Negative, Trace, Small (1+), Moderate (2+), Large (3+), 4+   Urobilinogen, UA     Nitrite, UA neg    Leukocytes, UA Negative Negative   Appearance     Odor      Assessment & Plan:  1) HIGH-risk pregnancy [id number] at [redacted]w[redacted]d with an Estimated Date of Delivery: 05/08/20   2) CHTN, BP was 162/97 today in the office. Will order the patient a BP cuff. Begin Rx Labetalol 200 mg BID.  3) H/O PP HTN  4) morbid obesity Body mass index is 50.62 kg/m.                  To discuss diet with dietician relative, calorie count.    Plan:  Continue routine obstetrical care bp check 1 wk.  Meds: No orders of the defined types were placed in this encounter.  Labs/procedures today: none  Follow-up: Return in about 1 week (around 11/22/2019) for  HROB, BP check.  By signing my name below, I, Pietro Cassis, attest that this documentation has been prepared under the direction and in the presence of Tilda Burrow, MD. Electronically Signed: Pietro Cassis, Medical Scribe. 11/15/19. 3:48 PM.  I personally performed the services described in this documentation, which was SCRIBED in my presence. The recorded information has been reviewed and considered accurate. It has been edited as necessary during review. Tilda Burrow, MD

## 2019-11-20 ENCOUNTER — Telehealth: Payer: Self-pay | Admitting: *Deleted

## 2019-11-20 ENCOUNTER — Other Ambulatory Visit: Payer: Self-pay | Admitting: *Deleted

## 2019-11-20 DIAGNOSIS — O10919 Unspecified pre-existing hypertension complicating pregnancy, unspecified trimester: Secondary | ICD-10-CM

## 2019-11-20 MED ORDER — LABETALOL HCL 200 MG PO TABS: 400.0000 mg | ORAL_TABLET | Freq: Three times a day (TID) | ORAL | 3 refills | Status: DC

## 2019-11-20 NOTE — Telephone Encounter (Signed)
Patient states her blood pressures have continued to be high despite taking her blood pressure medications.  Values of 179/107, 174/99, 140/105.  States she is not feeling well.  Informed I did not have a provider here in the office at this time to discuss pressures and medication but will call her back in a few minutes.  Advised to go ahead and take another 200mg  tablet.  Pt verbalized understanding.

## 2019-11-20 NOTE — Progress Notes (Signed)
Per Dr Emelda Fear, patient to increase Labetalol to 400 TID.  Pt verbalized understanding and new order sent to pharmacy.

## 2019-11-22 ENCOUNTER — Encounter: Payer: BC Managed Care – PPO | Admitting: Women's Health

## 2019-11-29 ENCOUNTER — Telehealth: Payer: Self-pay | Admitting: *Deleted

## 2019-11-29 ENCOUNTER — Telehealth: Payer: Self-pay | Admitting: Women's Health

## 2019-11-29 NOTE — Telephone Encounter (Signed)
Pt is 4 months needs to talk to a nurse about swelling and her blood pressure meds

## 2019-11-29 NOTE — Telephone Encounter (Signed)
LMOVM returning patient's call.  

## 2019-11-29 NOTE — Telephone Encounter (Signed)
Patient states her BP's continue to be elevated despite taking the Labetalol 400 TID as prescribed.  She has started school back this week and doesn't know if her pressures are elevated due to this. She has been getting the school nurse to check it when she is there and has been getting readings of 160-170's over 80-100's.  Last night BP was 186/102. Discussed with Dr Despina Hidden and advised to start 800mg  TID.  May need an additional medication added but will wait and see if pressures do better with this increase.  Pt verbalized understanding with all questions answered.

## 2019-11-30 ENCOUNTER — Telehealth: Payer: Self-pay | Admitting: Obstetrics & Gynecology

## 2019-11-30 DIAGNOSIS — O10919 Unspecified pre-existing hypertension complicating pregnancy, unspecified trimester: Secondary | ICD-10-CM

## 2019-11-30 MED ORDER — LABETALOL HCL 200 MG PO TABS: 800.0000 mg | ORAL_TABLET | Freq: Three times a day (TID) | ORAL | 3 refills | Status: DC

## 2019-12-05 ENCOUNTER — Ambulatory Visit (INDEPENDENT_AMBULATORY_CARE_PROVIDER_SITE_OTHER): Payer: BC Managed Care – PPO | Admitting: Advanced Practice Midwife

## 2019-12-05 ENCOUNTER — Telehealth: Payer: Self-pay | Admitting: *Deleted

## 2019-12-05 VITALS — BP 114/80 | HR 89 | Wt 312.0 lb

## 2019-12-05 DIAGNOSIS — Z419 Encounter for procedure for purposes other than remedying health state, unspecified: Secondary | ICD-10-CM | POA: Diagnosis not present

## 2019-12-05 DIAGNOSIS — Z1389 Encounter for screening for other disorder: Secondary | ICD-10-CM

## 2019-12-05 DIAGNOSIS — Z3A17 17 weeks gestation of pregnancy: Secondary | ICD-10-CM | POA: Diagnosis not present

## 2019-12-05 DIAGNOSIS — Z3481 Encounter for supervision of other normal pregnancy, first trimester: Secondary | ICD-10-CM | POA: Diagnosis not present

## 2019-12-05 DIAGNOSIS — Z331 Pregnant state, incidental: Secondary | ICD-10-CM

## 2019-12-05 DIAGNOSIS — O10919 Unspecified pre-existing hypertension complicating pregnancy, unspecified trimester: Secondary | ICD-10-CM

## 2019-12-05 DIAGNOSIS — Z1379 Encounter for other screening for genetic and chromosomal anomalies: Secondary | ICD-10-CM

## 2019-12-05 DIAGNOSIS — K529 Noninfective gastroenteritis and colitis, unspecified: Secondary | ICD-10-CM

## 2019-12-05 LAB — POCT URINALYSIS DIPSTICK OB
Blood, UA: NEGATIVE
Glucose, UA: NEGATIVE
Ketones, UA: NEGATIVE
Leukocytes, UA: NEGATIVE
Nitrite, UA: NEGATIVE

## 2019-12-05 NOTE — Progress Notes (Signed)
HIGH-RISK PREGNANCY VISIT Patient name: Jackie Rojas MRN [id number]  Date of birth: [birth year] Chief Complaint:   Vomiting/Nausea/Diarrhea  History of Present Illness:   Jackie Rojas is a 34 y.o. [phone] female at 65w6dwith an Estimated Date of Delivery: 05/08/20 being seen today for ongoing management of a high-risk pregnancy complicated by chronic hypertension currently on Labetalol 8079mtid.  Today she reports having N/V/D since 8/28 when she began her increased dose of Labetalol from 600 to 80041mstates that it happens immediately after taking the med, even with food; her upper abd remains tight for the rest of the day; had a cholecystectomy 02/2019.  [id number]  Decreased Interest 0  Down, Depressed, Hopeless 0  PHQ - 2 Score 0  Altered sleeping 0  Tired, decreased energy 0  Change in appetite 1  Feeling bad or failure about yourself  0  Trouble concentrating 0  Moving slowly or fidgety/restless 0  Suicidal thoughts 0  PHQ-9 Score 1     . Vag. Bleeding: None.  Movement: Present. denies leaking of fluid.  Review of Systems:   Pertinent items are noted in HPI Denies abnormal vaginal discharge w/ itching/odor/irritation, headaches, visual changes, shortness of breath, chest pain, abdominal pain, severe nausea/vomiting, or problems with urination or bowel movements unless otherwise stated above. Pertinent History Reviewed:  Reviewed past medical,surgical, social, obstetrical and family history.  Reviewed problem list, medications and allergies. Physical Assessment:   Vitals:   12/05/19 1048  BP: 114/80  Pulse: 89  Weight: (!) 312 lb (141.5 kg)  Body mass index is 50.36 kg/m.           Physical Examination:   General appearance: alert, well appearing, and in no distress  Mental status: alert, oriented to person, place, and time, drowsy  Skin: warm & dry   Extremities: Edema: None    Cardiovascular: normal heart rate noted  Respiratory:  normal respiratory effort, no distress  Abdomen: gravid, soft, non-tender  Pelvic: Cervical exam deferred         Fetal Status: Fetal Heart Rate (bpm): 149   Movement: Present     Results for orders placed or performed in visit on 12/05/19 (from the past 24 hour(s))  POC Urinalysis Dipstick OB   Collection Time: 12/05/19 10:49 AM  Result Value Ref Range   Color, UA     Clarity, UA     Glucose, UA Negative Negative   Bilirubin, UA     Ketones, UA n    Spec Grav, UA     Blood, UA n    pH, UA     POC,PROTEIN,UA Small (1+) Negative, Trace, Small (1+), Moderate (2+), Large (3+), 4+   Urobilinogen, UA     Nitrite, UA n    Leukocytes, UA Negative Negative   Appearance     Odor      Assessment & Plan:  1) High-risk pregnancy [id number] 17w36w6dh an Estimated Date of Delivery: 05/08/20   2) cHTN, Labetalol recently increased to 800mg74m, at the same time she noticed N/V/D after taking (did not have with the 200mg 53m00mg d36mg); in an attempt to determine if these are related, she will go back down to 200mg ti46marting tomorrow morning and will notify us if [place] the same reaction and we can change BP meds  3) Gastroenteritis, will get labs today; attempting to discern if this is a rare rxn to Labetalol or not  Meds: No orders  of the defined types were placed in this encounter.   Labs/procedures today: CMET, CBC, 2nd IT  Treatment Plan:  Anatomy scan at next visit  Reviewed: Preterm labor symptoms and general obstetric precautions including but not limited to vaginal bleeding, contractions, leaking of fluid and fetal movement were reviewed in detail with the patient.  All questions were answered. Is buying home bp cuff today. Check bp weekly, let us know if >140/90.   Follow-up: Return in about 2 weeks (around 12/19/2019) for Clever, in person, [place]: Anatomy.  Orders Placed This Encounter  Procedures  . US OB Comp + 14 Wk  . INTEGRATED 2  . Comp Met (CMET)  . CBC  . POC  Urinalysis Dipstick OB   Myrtis Ser [facility] 12/05/2019 11:25 AM

## 2019-12-05 NOTE — Telephone Encounter (Signed)
School nurse called stating patient has been in the school bathroom vomiting and complaining of stomach pain and feeling weak.  Her current blood pressure is 135/77 89  P97.2.  States that since increasing the blood pressure medication, she has been having stomach pains.  She is scheduled for an appointment this afternoon but would like to be seen sooner. Will move patient to sooner appointment.

## 2019-12-06 ENCOUNTER — Encounter: Payer: Self-pay | Admitting: *Deleted

## 2019-12-06 LAB — COMPREHENSIVE METABOLIC PANEL
ALT: 14 IU/L (ref 0–32)
AST: 15 IU/L (ref 0–40)
Albumin/Globulin Ratio: 1.2 (ref 1.2–2.2)
Albumin: 3.5 g/dL — ABNORMAL LOW (ref 3.8–4.8)
Alkaline Phosphatase: 55 IU/L (ref 48–121)
BUN/Creatinine Ratio: 12 (ref 9–23)
BUN: 8 mg/dL (ref 6–20)
Bilirubin Total: 0.3 mg/dL (ref 0.0–1.2)
CO2: 23 mmol/L (ref 20–29)
Calcium: 9.2 mg/dL (ref 8.7–10.2)
Chloride: 101 mmol/L (ref 96–106)
Creatinine, Ser: 0.69 mg/dL (ref 0.57–1.00)
GFR calc Af Amer: 130 mL/min/{1.73_m2} (ref >59)
GFR calc non Af Amer: 112 mL/min/{1.73_m2} (ref >59)
Globulin, Total: 2.9 g/dL (ref 1.5–4.5)
Glucose: 94 mg/dL (ref 65–99)
Potassium: 4.4 mmol/L (ref 3.5–5.2)
Sodium: 137 mmol/L (ref 134–144)
Total Protein: 6.4 g/dL (ref 6.0–8.5)

## 2019-12-06 LAB — CBC
Hematocrit: 36.1 % (ref 34.0–46.6)
Hemoglobin: 11.5 g/dL (ref 11.1–15.9)
MCH: 25.8 pg — ABNORMAL LOW (ref 26.6–33.0)
MCHC: 31.9 g/dL (ref 31.5–35.7)
MCV: 81 fL (ref 79–97)
Platelets: 236 10*3/uL (ref 150–450)
RBC: 4.46 x10E6/uL (ref 3.77–5.28)
RDW: 14.7 % (ref 11.7–15.4)
WBC: 9.7 10*3/uL (ref 3.4–10.8)

## 2019-12-07 LAB — INTEGRATED 2
AFP MoM: 0.84
Alpha-Fetoprotein: 18.8 ng/mL
Crown Rump Length: 54.2 mm
DIA MoM: 0.98
DIA Value: 103.4 pg/mL
Estriol, Unconjugated: 1.56 ng/mL
Gest. Age on Collection Date: 11.9 weeks
Gestational Age: 17.6 weeks
Maternal Age at EDD: 36.9 yr
Nuchal Translucency (NT): 1.3 mm
Nuchal Translucency MoM: 1.07
Number of Fetuses: 1
PAPP-A MoM: 0.62
PAPP-A Value: 191.7 ng/mL
Test Results:: NEGATIVE
Weight: 305 [lb_av]
Weight: 305 [lb_av]
hCG MoM: 1.84
hCG Value: 26.6 IU/mL
uE3 MoM: 1.55

## 2019-12-14 ENCOUNTER — Telehealth: Payer: Self-pay

## 2019-12-14 NOTE — Telephone Encounter (Signed)
Pt stated she is have mild to medium cramp on the right side. Coming and going going the last 2 days and overnight contestant it has not subsided, pain radiating into bottoms. Pt is currently 19 wk

## 2019-12-14 NOTE — Telephone Encounter (Signed)
Patient states she is having lower abdominal intermittent pain on her left side and in her back.  Denies urinary symptoms, fever, body aches, chills.  She is only drinking 2-3 bottles of water a day.  Advised patient to push some extra fluids today (at least 6-8 bottles) and if pain continued or worsened or she developed urinary symptoms, to go to [facility]. Pt verbalized understanding and agreeable to plan.

## 2019-12-17 ENCOUNTER — Telehealth: Payer: Self-pay | Admitting: Women's Health

## 2019-12-17 NOTE — Telephone Encounter (Signed)
Has a concern she needs to discuss, OB Pt , all information she gave, SHe would like for a nurse to give her a call

## 2019-12-17 NOTE — Telephone Encounter (Signed)
Left message @ 4:24 pm. JSY

## 2019-12-17 NOTE — Telephone Encounter (Signed)
Pt feels like she may be having some depression. She don't feel like getting out of bed, not wanting to shower, decrease in appetite. Pt feels emotional and was crying on the phone.  Pt has not felt suicidal or that she could harm anyone. Pt was advised she needs a virtual visit. Call transferred to [facility] for appt. JSY

## 2019-12-18 ENCOUNTER — Encounter: Payer: Self-pay | Admitting: Women's Health

## 2019-12-18 ENCOUNTER — Telehealth (INDEPENDENT_AMBULATORY_CARE_PROVIDER_SITE_OTHER): Payer: BC Managed Care – PPO | Admitting: Women's Health

## 2019-12-18 DIAGNOSIS — O0992 Supervision of high risk pregnancy, unspecified, second trimester: Secondary | ICD-10-CM

## 2019-12-18 DIAGNOSIS — F418 Other specified anxiety disorders: Secondary | ICD-10-CM | POA: Insufficient documentation

## 2019-12-18 DIAGNOSIS — O0990 Supervision of high risk pregnancy, unspecified, unspecified trimester: Secondary | ICD-10-CM | POA: Diagnosis not present

## 2019-12-18 MED ORDER — SERTRALINE HCL 25 MG PO TABS: 25.0000 mg | ORAL_TABLET | Freq: Every day | ORAL | 11 refills | Status: DC

## 2019-12-18 NOTE — Progress Notes (Addendum)
TELEHEALTH VIRTUAL OBSTETRICS VISIT ENCOUNTER NOTE Patient name: Jackie Rojas MRN [id number]  Date of birth: Aug 16, 1985  I connected with patient on 12/18/19 at  1:50 PM EDT by telephone (can't get mychart to work) and verified that I am speaking with the correct person using two identifiers. Pt is not currently in our office, she is at home.  The provider is in the office.    I discussed the limitations, risks, security and privacy concerns of performing an evaluation and management service by telephone and the availability of in person appointments. I also discussed with the patient that there may be a patient responsible charge related to this service. The patient expressed understanding and agreed to proceed.  Chief Complaint:   Depression  History of Present Illness:   Jackie Rojas is a 34 y.o. [phone] female at [redacted]w[redacted]d with an Estimated Date of Delivery: 05/08/20 being evaluated today for ongoing management of a high-risk pregnancy complicated by [facility] on Labetalol 800mg  TID.  Depression screen [facility] 2/9 12/18/2019 10/26/2019  Decreased Interest 3 0  Down, Depressed, Hopeless 3 0  PHQ - 2 Score 6 0  Altered sleeping 3 0  Tired, decreased energy 2 0  Change in appetite 3 1  Feeling bad or failure about yourself  3 0  Trouble concentrating 3 0  Moving slowly or fidgety/restless 1 0  Suicidal thoughts 1 0  PHQ-9 Score 22 1  Difficult doing work/chores Very difficult -    Today she reports anxiety, crying spells, self-concious, lows, very little highs, trouble sleeping-maybe 2 hrs/night, would call into work everyday if she could, tired of 'acting ok'. H/O PPD, was scared to take medicine. Does have h/o dep/anx, has done therapy in [place], last about 46yrs ago, felt it helped 'somewhat'. No thoughts of self-harm, just thoughts she just wants to just lay there. Cries herself to sleep. Binge watches TV to help her go to sleep, just can't turn mind off. Overeating and undereating. Does not find  joy in things she used to. Discussed options, wants meds and therapy.   Contractions: Not present. Vag. Bleeding: None.  Movement: Present. denies leaking of fluid. Review of Systems:   Pertinent items are noted in HPI Denies abnormal vaginal discharge w/ itching/odor/irritation, headaches, visual changes, shortness of breath, chest pain, abdominal pain, severe nausea/vomiting, or problems with urination or bowel movements unless otherwise stated above. Pertinent History Reviewed:  Reviewed past medical,surgical, social, obstetrical and family history.  Reviewed problem list, medications and allergies. Physical Assessment:  There were no vitals filed for this visit.There is no height or weight on file to calculate BMI.        Physical Examination:   General:  Alert, oriented and cooperative.   Mental Status: Normal mood and affect perceived. Normal judgment and thought content.  Rest of physical exam deferred due to type of encounter  No results found for this or any previous visit (from the past 24 hour(s)).  Assessment & Plan:  1) Pregnancy 34yr at [redacted]w[redacted]d with an Estimated Date of Delivery: 05/08/20   2) Dep/anx, rx zoloft 25mg  daily, understands can take a few weeks to notice improvement, referral to [facility] for therapy   Meds:  Meds ordered this encounter  Medications  . sertraline (ZOLOFT) 25 MG tablet    Sig: Take 1 tablet (25 mg total) by mouth daily.    Dispense:  30 tablet    Refill:  11    Order Specific Question:   Supervising Provider  Answer:   Lazaro Arms [[id number]]    Labs/procedures today: none  Plan:  Continue routine obstetrical care.  Does not have home bp cuff.    Reviewed: Preterm labor symptoms and general obstetric precautions including but not limited to vaginal bleeding, contractions, leaking of fluid and fetal movement were reviewed in detail with the patient. The patient was advised to call back or seek an in-person office evaluation/go to [facility] at [facility] for any urgent or concerning symptoms. All questions were answered. Please refer to After Visit Summary for other counseling recommendations.    I provided 15 minutes of non-face-to-face time during this encounter.  Follow-up: Return for 1wk as scheduled for u/s and hrob w/ JVF.  Orders Placed This Encounter  Procedures  . Amb ref to [facility]   Cheral Marker CNM, [facility] 12/18/2019 2:56 PM

## 2019-12-25 ENCOUNTER — Ambulatory Visit (INDEPENDENT_AMBULATORY_CARE_PROVIDER_SITE_OTHER): Payer: BC Managed Care – PPO | Admitting: Obstetrics and Gynecology

## 2019-12-25 ENCOUNTER — Ambulatory Visit (INDEPENDENT_AMBULATORY_CARE_PROVIDER_SITE_OTHER): Payer: BC Managed Care – PPO

## 2019-12-25 VITALS — BP 144/89 | HR 86 | Wt 318.2 lb

## 2019-12-25 DIAGNOSIS — O0990 Supervision of high risk pregnancy, unspecified, unspecified trimester: Secondary | ICD-10-CM | POA: Diagnosis not present

## 2019-12-25 DIAGNOSIS — Z3481 Encounter for supervision of other normal pregnancy, first trimester: Secondary | ICD-10-CM

## 2019-12-25 DIAGNOSIS — O10919 Unspecified pre-existing hypertension complicating pregnancy, unspecified trimester: Secondary | ICD-10-CM

## 2019-12-25 LAB — POCT URINALYSIS DIPSTICK OB
Blood, UA: NEGATIVE
Glucose, UA: NEGATIVE
Ketones, UA: NEGATIVE
Leukocytes, UA: NEGATIVE
Nitrite, UA: NEGATIVE
POC,PROTEIN,UA: NEGATIVE

## 2019-12-25 NOTE — Progress Notes (Signed)
PATIENT ID: Ambrose Mantle, female     DOB: Dec 21, 1985, 34 y.o.     MRN: [id number]     [facility] PREGNANCY VISIT PATIENT NAME: Jackie Rojas MRN [id number]  DOB: [birth year] Chief Complaint:   No chief complaint on file.   History of Present Illness:   Jackie Rojas is a 34 y.o. [phone] female at [redacted]w[redacted]d with an Estimated Date of Delivery: 05/08/20 being seen today for ongoing management of a high-risk pregnancy complicated by chronic hypertension currently on Labetalol 800mg  TID.  Today she reports That she was reduced on her blood pressure medicines to 200 mg 3 times daily.   Depression screen [facility] 2/9 12/18/2019 10/26/2019  Decreased Interest 3 0  Down, Depressed, Hopeless 3 0  PHQ - 2 Score 6 0  Altered sleeping 3 0  Tired, decreased energy 2 0  Change in appetite 3 1  Feeling bad or failure about yourself  3 0  Trouble concentrating 3 0  Moving slowly or fidgety/restless 1 0  Suicidal thoughts 1 0  PHQ-9 Score 22 1  Difficult doing work/chores Very difficult -     .  .   . denies leaking of fluid.   Review of Systems:   Pertinent items are noted in HPI Denies abnormal vaginal discharge w/ itching/odor/irritation, headaches, visual changes, shortness of breath, chest pain, abdominal pain, severe nausea/vomiting, or problems with urination or bowel movements unless otherwise stated above.  Pertinent History Reviewed:  Reviewed past medical,surgical, social, obstetrical and family history.  Reviewed problem list, medications and allergies.  Physical Assessment:  There were no vitals filed for this visit.There is no height or weight on file to calculate BMI.           Physical Examination: BP (!) 144/89   Pulse 86   Wt (!) 318 lb 3.2 oz (144.3 kg)   LMP 07/17/2019 (Approximate)   BMI 51.36 kg/m  Weight gain has reached from 304 to 318 in the first few weeks of pregnancy  General appearance: alert, well appearing, and in no distress, oriented to person, place, and time and  overweight  Mental status: alert, oriented to person, place, and time, normal mood, behavior, speech, dress, motor activity, and thought processes  Skin: warm & dry   Extremities:      Cardiovascular: normal heart rate noted  Respiratory: normal respiratory effort, no distress  Abdomen: gravid, soft, non-tender   Baby:    24 cm  Pelvic: Cervical exam deferred         Fetal Status:          Fetal Surveillance Testing today anatomy scan: TECHNICIAN COMMENTS:  07/19/2019 20+5 wks,cephalic,anterior placenta gr 0,normal ovaries,cx 4.3 cm,svp of fluid,EFW 395 g 62%,anatomy complete,no obvious abnormalities,limited view because of pt body habitus    [place] 12/25/2019 4:05 PM    Chaperone: n/a    No results found for this or any previous visit (from the past 24 hour(s)).   Assessment & Plan:  1) High-risk pregnancy [id number] at [redacted]w[redacted]d with an Estimated Date of Delivery: 05/08/20   2) morbid obesity, worsening with weight gain in pregnancy,Body mass index is 51.36 kg/m.  counseled extensively on recommendations to  avoid any further weight gain  3) chronic hypertension, blood pressure requirement will increase again today 400 mg 3 times daily,   Meds: No orders of the defined types were placed in this encounter.   Labs/procedures today:  Lab Orders  No laboratory test(s) ordered today   Procedure Orders  No procedure(s) ordered today    Treatment Plan: Increase labetalol to 400 mg 3 times daily patient check blood pressure daily report any pressures over 140/90 to the office  Reviewed: Preterm labor symptoms and general obstetric precautions including but not limited to vaginal bleeding, contractions, leaking of fluid and fetal movement were reviewed in detail with the patient.  All questions were answered. Check bp weekly, let us know if >140/90.   Follow-up: 4 weeks or as noted by phone   By signing my name below, I, [employer], attest that this documentation has been  prepared under the direction and in the presence of Tilda Burrow, MD. Electronically Signed: Mal Misty Medical Scribe. 12/25/19. 11:41 AM.  I personally performed the services described in this documentation, which was SCRIBED in my presence. The recorded information has been reviewed and considered accurate. It has been edited as necessary during review. Tilda Burrow, MD

## 2019-12-25 NOTE — Patient Instructions (Signed)
[facility] at [facility] [address] [place]  [id number]  Get Driving Directions  Main: [phone]    You will have your sugar test next visit.  Please do not eat or drink anything after midnight the night before you come, not even water.  You will be here for at least two hours.  Please make an appointment online for the bloodwork at [email or web address] for 8:30am (or as close to this as possible). Make sure you select the [facility] service center. The day of the appointment, check in with our office first, then you will go to [facility] to start the sugar test.    [facility] at [facility] ([address] [place], [place] [address]) Entrance C, located off of [address] Free 24/7 valet parking  Go to [email or web address] to register for FREE online childbirth classes   Call the office [phone]) or go to [facility] if:  You begin to have strong, frequent contractions  Your water breaks.  Sometimes it is a big gush of fluid, sometimes it is just a trickle that keeps getting your panties wet or running down your legs  You have vaginal bleeding.  It is normal to have a small amount of spotting if your cervix was checked.   You don't feel your baby moving like normal.  If you don't, get you something to eat and drink and lay down and focus on feeling your baby move.   If your baby is still not moving like normal, you should call the office or go to [facility].  [place] Pediatricians/Family Doctors:  [facility] [phone]            [facility] [phone]                 [facility] [phone] (usually not accepting new patients unless you have family there already, you are always welcome to call and ask)       [facility] [phone]       [facility]:   [facility]:  [phone]  [facility]: [phone]  [facility]: [phone]  [facility]:   [facility]: [phone]   [facility]: [phone]  [facility]:  [facility]: [phone]   Home Blood Pressure Monitoring for Patients   Your provider has recommended that you check your blood pressure (BP) at least once a week at home. If you do not have a blood pressure cuff at home, one will be provided for you. Contact your provider if you have not received your monitor within 1 week.   Helpful Tips for Accurate Home Blood Pressure Checks  . Don't smoke, exercise, or drink caffeine 30 minutes before checking your BP . Use the restroom before checking your BP (a full bladder can raise your pressure) . Relax in a comfortable upright chair . Feet on the ground . Left arm resting comfortably on a flat surface at the level of your heart . Legs uncrossed . Back supported . Sit quietly and don't talk . Place the cuff on your bare arm . Adjust snuggly, so that only two fingertips can fit between your skin and the top of the cuff . Check 2 readings separated by at least one minute . Keep a log of your BP readings . For a visual, please reference this diagram: http://ccnc.care/bpdiagram  Provider  Name: [facility]     Phone: [phone]  Zone 1: ALL CLEAR  Continue to monitor your symptoms:  . BP reading is less than 140 (top number) or less than 90 (bottom number)  . No right upper stomach pain . No headaches or seeing spots . No feeling nauseated or throwing up . No swelling in face and hands  Zone 2: CAUTION Call your doctor's office for any of the following:  . BP reading is greater than 140 (top number) or greater than 90 (bottom number)  . Stomach pain under your ribs in the middle or right side . Headaches or seeing spots . Feeling nauseated or throwing up . Swelling in  face and hands  Zone 3: EMERGENCY  Seek immediate medical care if you have any of the following:  . BP reading is greater than160 (top number) or greater than 110 (bottom number) . Severe headaches not improving with Tylenol . Serious difficulty catching your breath . Any worsening symptoms from Zone 2   Second Trimester of Pregnancy The second trimester is from week 13 through week 28, months 4 through 6. The second trimester is often a time when you feel your best. Your body has also adjusted to being pregnant, and you begin to feel better physically. Usually, morning sickness has lessened or quit completely, you may have more energy, and you may have an increase in appetite. The second trimester is also a time when the fetus is growing rapidly. At the end of the sixth month, the fetus is about 9 inches long and weighs about 1 pounds. You will likely begin to feel the baby move (quickening) between 18 and 20 weeks of the pregnancy.  BODY CHANGES Your body goes through many changes during pregnancy. The changes vary from woman to woman.   Your weight will continue to increase. You will notice your lower abdomen bulging out.  You may begin to get stretch marks on your hips, abdomen, and breasts.  You may develop headaches that can be relieved by medicines approved by your health care provider.  You may urinate more often because the fetus is pressing on your bladder.  You may develop or continue to have heartburn as a result of your pregnancy.  You may develop constipation because certain hormones are causing the muscles that push waste through your intestines to slow down.  You may develop hemorrhoids or swollen, bulging veins (varicose veins).  You may have back pain because of the weight gain and pregnancy hormones relaxing your joints between the bones in your pelvis and as a result of a shift in weight and the muscles that support your balance.  Your breasts will continue to grow  and be tender.  Your gums may bleed and may be sensitive to brushing and flossing.  Dark spots or blotches (chloasma, mask of pregnancy) may develop on your face. This will likely fade after the baby is born.  A dark line from your belly button to the pubic area (linea nigra) may appear. This will likely fade after the baby is born.  You may have changes in your hair. These can include thickening of your hair, rapid growth, and changes in texture. Some women also have hair loss during or after pregnancy, or hair that feels dry or thin. Your hair will most likely return to normal after your baby is born.  WHAT TO EXPECT AT YOUR PRENATAL VISITS During a routine prenatal visit:  You will be weighed to  make sure you and the fetus are growing normally.  Your blood pressure will be taken.  Your abdomen will be measured to track your baby's growth.  The fetal heartbeat will be listened to.  Any test results from the previous visit will be discussed.  Your health care provider may ask you:  How you are feeling.  If you are feeling the baby move.  If you have had any abnormal symptoms, such as leaking fluid, bleeding, severe headaches, or abdominal cramping.  If you have any questions.  Other tests that may be performed during your second trimester include:  Blood tests that check for:  Low iron levels (anemia).  Gestational diabetes (between 24 and 28 weeks).  Rh antibodies.  Urine tests to check for infections, diabetes, or protein in the urine.  An ultrasound to confirm the proper growth and development of the baby.  An amniocentesis to check for possible genetic problems.  Fetal screens for spina bifida and Down syndrome.  HOME CARE INSTRUCTIONS   Avoid all smoking, herbs, alcohol, and unprescribed drugs. These chemicals affect the formation and growth of the baby.  Follow your health care provider's instructions regarding medicine use. There are medicines that are  either safe or unsafe to take during pregnancy.  Exercise only as directed by your health care provider. Experiencing uterine cramps is a good sign to stop exercising.  Continue to eat regular, healthy meals.  Wear a good support bra for breast tenderness.  Do not use hot tubs, steam rooms, or saunas.  Wear your seat belt at all times when driving.  Avoid raw meat, uncooked cheese, cat litter boxes, and soil used by cats. These carry germs that can cause birth defects in the baby.  Take your prenatal vitamins.  Try taking a stool softener (if your health care provider approves) if you develop constipation. Eat more high-fiber foods, such as fresh vegetables or fruit and whole grains. Drink plenty of fluids to keep your urine clear or pale yellow.  Take warm sitz baths to soothe any pain or discomfort caused by hemorrhoids. Use hemorrhoid cream if your health care provider approves.  If you develop varicose veins, wear support hose. Elevate your feet for 15 minutes, 3-4 times a day. Limit salt in your diet.  Avoid heavy lifting, wear low heel shoes, and practice good posture.  Rest with your legs elevated if you have leg cramps or low back pain.  Visit your dentist if you have not gone yet during your pregnancy. Use a soft toothbrush to brush your teeth and be gentle when you floss.  A sexual relationship may be continued unless your health care provider directs you otherwise.  Continue to go to all your prenatal visits as directed by your health care provider.  SEEK MEDICAL CARE IF:   You have dizziness.  You have mild pelvic cramps, pelvic pressure, or nagging pain in the abdominal area.  You have persistent nausea, vomiting, or diarrhea.  You have a bad smelling vaginal discharge.  You have pain with urination.  SEEK IMMEDIATE MEDICAL CARE IF:   You have a fever.  You are leaking fluid from your vagina.  You have spotting or bleeding from your vagina.  You have  severe abdominal cramping or pain.  You have rapid weight gain or loss.  You have shortness of breath with chest pain.  You notice sudden or extreme swelling of your face, hands, ankles, feet, or legs.  You have not felt your baby  move in over an hour.  You have severe headaches that do not go away with medicine.  You have vision changes.  Document Released: 03/16/2001 Document Revised: 03/27/2013 Document Reviewed: 05/23/2012 [facility] Patient Information 2015 [place], [place]. This information is not intended to replace advice given to you by your health care provider. Make sure you discuss any questions you have with your health care provider.

## 2019-12-25 NOTE — Progress Notes (Signed)
[place] 20+5 wks,cephalic,anterior placenta gr 0,normal ovaries,cx 4.3 cm,svp of fluid,EFW 395 g 62%,anatomy complete,no obvious abnormalities,limited view because of pt body habitus

## 2019-12-26 ENCOUNTER — Other Ambulatory Visit: Payer: Self-pay

## 2019-12-26 ENCOUNTER — Other Ambulatory Visit: Payer: BC Managed Care – PPO

## 2019-12-26 DIAGNOSIS — Z20822 Contact with and (suspected) exposure to covid-19: Secondary | ICD-10-CM

## 2019-12-28 LAB — SARS-COV-2, NAA 2 DAY TAT

## 2019-12-28 LAB — NOVEL CORONAVIRUS, NAA: SARS-CoV-2, NAA: NOT DETECTED

## 2020-01-04 DIAGNOSIS — Z419 Encounter for procedure for purposes other than remedying health state, unspecified: Secondary | ICD-10-CM | POA: Diagnosis not present

## 2020-01-09 NOTE — BH Specialist Note (Deleted)
Pt did not arrive to video visit and did not answer the phone ; Voicemail is full, so unable to leave a message; left MyChart message for patient.

## 2020-01-22 ENCOUNTER — Ambulatory Visit (INDEPENDENT_AMBULATORY_CARE_PROVIDER_SITE_OTHER): Payer: BC Managed Care – PPO | Admitting: Obstetrics and Gynecology

## 2020-01-22 ENCOUNTER — Encounter: Payer: Self-pay | Admitting: Obstetrics and Gynecology

## 2020-01-22 ENCOUNTER — Other Ambulatory Visit (HOSPITAL_COMMUNITY)
Admission: RE | Admit: 2020-01-22 | Discharge: 2020-01-22 | Disposition: A | Discharge status: Home / Self Care | Payer: BC Managed Care – PPO | Source: Ambulatory Visit | Attending: Obstetrics and Gynecology | Admitting: Obstetrics and Gynecology

## 2020-01-22 VITALS — BP 132/84 | HR 95 | Wt 317.6 lb

## 2020-01-22 DIAGNOSIS — Z331 Pregnant state, incidental: Secondary | ICD-10-CM

## 2020-01-22 DIAGNOSIS — Z1389 Encounter for screening for other disorder: Secondary | ICD-10-CM

## 2020-01-22 DIAGNOSIS — O285 Abnormal chromosomal and genetic finding on antenatal screening of mother: Secondary | ICD-10-CM

## 2020-01-22 DIAGNOSIS — O0990 Supervision of high risk pregnancy, unspecified, unspecified trimester: Secondary | ICD-10-CM | POA: Insufficient documentation

## 2020-01-22 DIAGNOSIS — A6000 Herpesviral infection of urogenital system, unspecified: Secondary | ICD-10-CM

## 2020-01-22 DIAGNOSIS — Z3A24 24 weeks gestation of pregnancy: Secondary | ICD-10-CM | POA: Insufficient documentation

## 2020-01-22 DIAGNOSIS — O10919 Unspecified pre-existing hypertension complicating pregnancy, unspecified trimester: Secondary | ICD-10-CM

## 2020-01-22 LAB — POCT URINALYSIS DIPSTICK OB
Blood, UA: NEGATIVE
Glucose, UA: NEGATIVE
Ketones, UA: NEGATIVE
Leukocytes, UA: NEGATIVE
Nitrite, UA: NEGATIVE
POC,PROTEIN,UA: NEGATIVE

## 2020-01-22 NOTE — Patient Instructions (Signed)

## 2020-01-22 NOTE — Progress Notes (Signed)
Subjective:  Jackie Rojas is a 34 y.o. [phone] at [redacted]w[redacted]d being seen today for ongoing prenatal care.  She is currently monitored for the following issues for this high-risk pregnancy and has Genital HSV; Migraine syndrome; Supervision of high risk pregnancy, antepartum; Silent carrier for alpha-thalassemia; Chronic hypertension affecting pregnancy; and Depression with anxiety on their problem list.  Patient reports desires STD testing.  Contractions: Not present. Vag. Bleeding: None.  Movement: Present. Denies leaking of fluid.   The following portions of the patient's history were reviewed and updated as appropriate: allergies, current medications, past family history, past medical history, past social history, past surgical history and problem list. Problem list updated.  Objective:   Vitals:   01/22/20 1540 01/22/20 1546  BP: 140/82 132/84  Pulse: 95   Weight: (!) 317 lb 9.6 oz (144.1 kg)     Fetal Status:     Movement: Present     General:  Alert, oriented and cooperative. Patient is in no acute distress.  Skin: Skin is warm and dry. No rash noted.   Cardiovascular: Normal heart rate noted  Respiratory: Normal respiratory effort, no problems with respiration noted  Abdomen: Soft, gravid, appropriate for gestational age. Pain/Pressure: Absent     Pelvic:  Cervical exam performed        Extremities: Normal range of motion.  Edema: None  Mental Status: Normal mood and affect. Normal behavior. Normal judgment and thought content.   Urinalysis:      Assessment and Plan:  Pregnancy: [id number] at [redacted]w[redacted]d  1. Supervision of high risk pregnancy, antepartum Stable Glucola next visit - POC Urinalysis Dipstick OB - Cervicovaginal ancillary only( [place])  2. Pregnancy, incidental  - POC Urinalysis Dipstick OB - Cervicovaginal ancillary only( [place])  3. Screening for genitourinary condition  - POC Urinalysis Dipstick OB  4. [redacted] weeks gestation of pregnancy  - POC  Urinalysis Dipstick OB - Cervicovaginal ancillary only( [place])  5. Chronic hypertension affecting pregnancy BP stable Normal growth on last scan Continue with Labetalol and growth scan next visit - US OB Follow Up; Future  6. Genital herpes simplex, unspecified site Suppression at 34-36 weeks  7. Silent carrier for alpha-thalassemia   Preterm labor symptoms and general obstetric precautions including but not limited to vaginal bleeding, contractions, leaking of fluid and fetal movement were reviewed in detail with the patient. Please refer to After Visit Summary for other counseling recommendations.  Return in about 4 weeks (around 02/19/2020) for OB visit, face to face, any provider, glucola and OB u/s with next ob appt.   Hermina Staggers, MD

## 2020-01-24 LAB — CERVICOVAGINAL ANCILLARY ONLY
Bacterial Vaginitis (gardnerella): NEGATIVE
Candida Glabrata: NEGATIVE
Candida Vaginitis: NEGATIVE
Chlamydia: NEGATIVE
Comment: NEGATIVE
Comment: NEGATIVE
Comment: NEGATIVE
Comment: NEGATIVE
Comment: NEGATIVE
Comment: NORMAL
Neisseria Gonorrhea: NEGATIVE
Trichomonas: NEGATIVE

## 2020-02-04 DIAGNOSIS — Z419 Encounter for procedure for purposes other than remedying health state, unspecified: Secondary | ICD-10-CM | POA: Diagnosis not present

## 2020-02-19 ENCOUNTER — Other Ambulatory Visit: Payer: Self-pay

## 2020-02-19 ENCOUNTER — Other Ambulatory Visit: Payer: BC Managed Care – PPO

## 2020-02-19 ENCOUNTER — Ambulatory Visit (INDEPENDENT_AMBULATORY_CARE_PROVIDER_SITE_OTHER): Payer: BC Managed Care – PPO

## 2020-02-19 ENCOUNTER — Encounter: Payer: Self-pay | Admitting: Women's Health

## 2020-02-19 ENCOUNTER — Ambulatory Visit (INDEPENDENT_AMBULATORY_CARE_PROVIDER_SITE_OTHER): Payer: BC Managed Care – PPO | Admitting: Women's Health

## 2020-02-19 VITALS — BP 151/89 | HR 97 | Wt 318.4 lb

## 2020-02-19 DIAGNOSIS — Z3A28 28 weeks gestation of pregnancy: Secondary | ICD-10-CM

## 2020-02-19 DIAGNOSIS — Z23 Encounter for immunization: Secondary | ICD-10-CM

## 2020-02-19 DIAGNOSIS — O0992 Supervision of high risk pregnancy, unspecified, second trimester: Secondary | ICD-10-CM

## 2020-02-19 DIAGNOSIS — Z1389 Encounter for screening for other disorder: Secondary | ICD-10-CM

## 2020-02-19 DIAGNOSIS — O0990 Supervision of high risk pregnancy, unspecified, unspecified trimester: Secondary | ICD-10-CM

## 2020-02-19 DIAGNOSIS — O10919 Unspecified pre-existing hypertension complicating pregnancy, unspecified trimester: Secondary | ICD-10-CM | POA: Diagnosis not present

## 2020-02-19 DIAGNOSIS — Z3483 Encounter for supervision of other normal pregnancy, third trimester: Secondary | ICD-10-CM

## 2020-02-19 DIAGNOSIS — O0993 Supervision of high risk pregnancy, unspecified, third trimester: Secondary | ICD-10-CM

## 2020-02-19 DIAGNOSIS — Z331 Pregnant state, incidental: Secondary | ICD-10-CM

## 2020-02-19 LAB — POCT URINALYSIS DIPSTICK OB
Blood, UA: NEGATIVE
Glucose, UA: NEGATIVE
Ketones, UA: NEGATIVE
Leukocytes, UA: NEGATIVE
Nitrite, UA: NEGATIVE
POC,PROTEIN,UA: NEGATIVE

## 2020-02-19 NOTE — Patient Instructions (Signed)
Jackie Rojas, I greatly value your feedback.  If you receive a survey following your visit with [place] today, we appreciate you taking the time to fill it out.  Thanks, [name], CNM, WHNP-BC   [facility] ([address] [place], [place] [address]) Entrance C, located off of [address] valet parking  Go to [email or web address] to register for FREE online childbirth classes   Call the office [phone]) or go to [facility] if:  You begin to have strong, frequent contractions  Your water breaks.  Sometimes it is a big gush of fluid, sometimes it is just a trickle that keeps getting your panties wet or running down your legs  You have vaginal bleeding.  It is normal to have a small amount of spotting if your cervix was checked.   You don't feel your baby moving like normal.  If you don't, get you something to eat and drink and lay down and focus on feeling your baby move.  You should feel at least 10 movements in 2 hours.  If you don't, you should call the office or go to [facility].    Tdap Vaccine  It is recommended that you get the Tdap vaccine during the third trimester of EACH pregnancy to help protect your baby from getting pertussis (whooping cough)  27-36 weeks is the BEST time to do this so that you can pass the protection on to your baby. During pregnancy is better than after pregnancy, but if you are unable to get it during pregnancy it will be offered at the hospital.   You can get this vaccine with [place], at the health department, your family doctor, or some local pharmacies  Everyone who will be around your baby should also be up-to-date on their vaccines before the baby comes. Adults (who are not pregnant) only need 1 dose of Tdap during adulthood.   [place] Pediatricians/Family Doctors:  [facility] [phone]            [facility] [phone]                 [facility]  [phone] (usually not accepting new patients unless you have family there already, you are always welcome to call and ask)       [facility] Department [phone]       [facility] Pediatricians/Family Doctors:   [facility]: [phone]  [facility]: [phone]  [facility]: [phone]  [facility] Doctors:   [facility]: [phone]   [facility]: [phone]  [facility] Doctors:  [facility]: [phone]   Home Blood Pressure Monitoring for Patients   Your provider has recommended that you check your blood pressure (BP) at least once a week at home. If you do not have a blood pressure cuff at home, one will be provided for you. Contact your provider if you have not received your monitor within 1 week.   Helpful Tips for Accurate Home Blood Pressure Checks  . Don't smoke, exercise, or drink caffeine 30 minutes before checking your BP . Use the restroom before checking your BP (a full bladder can raise your pressure) . Relax in a comfortable upright chair . Feet on the ground . Left arm resting comfortably on a flat surface at the level of your heart . Legs uncrossed . Back supported . Sit quietly and don't talk . Place the cuff on your bare  arm . Adjust snuggly, so that only two fingertips can fit between your skin and the top of the cuff . Check 2 readings separated by at least one minute . Keep a log of your BP readings . For a visual, please reference this diagram: http://ccnc.care/bpdiagram  Provider Name: [facility]     Phone: [phone]  Zone 1: ALL CLEAR  Continue to monitor your symptoms:  . BP reading is less than 140 (top number) or less than 90 (bottom number)  . No right upper stomach pain . No headaches or seeing spots . No feeling nauseated or throwing up . No swelling in face and hands  Zone 2: CAUTION Call your  doctor's office for any of the following:  . BP reading is greater than 140 (top number) or greater than 90 (bottom number)  . Stomach pain under your ribs in the middle or right side . Headaches or seeing spots . Feeling nauseated or throwing up . Swelling in face and hands  Zone 3: EMERGENCY  Seek immediate medical care if you have any of the following:  . BP reading is greater than160 (top number) or greater than 110 (bottom number) . Severe headaches not improving with Tylenol . Serious difficulty catching your breath . Any worsening symptoms from Zone 2   Third Trimester of Pregnancy The third trimester is from week 29 through week 42, months 7 through 9. The third trimester is a time when the fetus is growing rapidly. At the end of the ninth month, the fetus is about 20 inches in length and weighs 6-10 pounds.  BODY CHANGES Your body goes through many changes during pregnancy. The changes vary from woman to woman.   Your weight will continue to increase. You can expect to gain 25-35 pounds (11-16 kg) by the end of the pregnancy.  You may begin to get stretch marks on your hips, abdomen, and breasts.  You may urinate more often because the fetus is moving lower into your pelvis and pressing on your bladder.  You may develop or continue to have heartburn as a result of your pregnancy.  You may develop constipation because certain hormones are causing the muscles that push waste through your intestines to slow down.  You may develop hemorrhoids or swollen, bulging veins (varicose veins).  You may have pelvic pain because of the weight gain and pregnancy hormones relaxing your joints between the bones in your pelvis. Backaches may result from overexertion of the muscles supporting your posture.  You may have changes in your hair. These can include thickening of your hair, rapid growth, and changes in texture. Some women also have hair loss during or after pregnancy, or hair that  feels dry or thin. Your hair will most likely return to normal after your baby is born.  Your breasts will continue to grow and be tender. A yellow discharge may leak from your breasts called colostrum.  Your belly button may stick out.  You may feel short of breath because of your expanding uterus.  You may notice the fetus "dropping," or moving lower in your abdomen.  You may have a bloody mucus discharge. This usually occurs a few days to a week before labor begins.  Your cervix becomes thin and soft (effaced) near your due date. WHAT TO EXPECT AT YOUR PRENATAL EXAMS  You will have prenatal exams every 2 weeks until week 36. Then, you will have weekly prenatal exams. During a routine prenatal visit:  You  will be weighed to make sure you and the fetus are growing normally.  Your blood pressure is taken.  Your abdomen will be measured to track your baby's growth.  The fetal heartbeat will be listened to.  Any test results from the previous visit will be discussed.  You may have a cervical check near your due date to see if you have effaced. At around 36 weeks, your caregiver will check your cervix. At the same time, your caregiver will also perform a test on the secretions of the vaginal tissue. This test is to determine if a type of bacteria, Group B streptococcus, is present. Your caregiver will explain this further. Your caregiver may ask you:  What your birth plan is.  How you are feeling.  If you are feeling the baby move.  If you have had any abnormal symptoms, such as leaking fluid, bleeding, severe headaches, or abdominal cramping.  If you have any questions. Other tests or screenings that may be performed during your third trimester include:  Blood tests that check for low iron levels (anemia).  Fetal testing to check the health, activity level, and growth of the fetus. Testing is done if you have certain medical conditions or if there are problems during the  pregnancy. FALSE LABOR You may feel small, irregular contractions that eventually go away. These are called Braxton Hicks contractions, or false labor. Contractions may last for hours, days, or even weeks before true labor sets in. If contractions come at regular intervals, intensify, or become painful, it is best to be seen by your caregiver.  SIGNS OF LABOR   Menstrual-like cramps.  Contractions that are 5 minutes apart or less.  Contractions that start on the top of the uterus and spread down to the lower abdomen and back.  A sense of increased pelvic pressure or back pain.  A watery or bloody mucus discharge that comes from the vagina. If you have any of these signs before the 37th week of pregnancy, call your caregiver right away. You need to go to the hospital to get checked immediately. HOME CARE INSTRUCTIONS   Avoid all smoking, herbs, alcohol, and unprescribed drugs. These chemicals affect the formation and growth of the baby.  Follow your caregiver's instructions regarding medicine use. There are medicines that are either safe or unsafe to take during pregnancy.  Exercise only as directed by your caregiver. Experiencing uterine cramps is a good sign to stop exercising.  Continue to eat regular, healthy meals.  Wear a good support bra for breast tenderness.  Do not use hot tubs, steam rooms, or saunas.  Wear your seat belt at all times when driving.  Avoid raw meat, uncooked cheese, cat litter boxes, and soil used by cats. These carry germs that can cause birth defects in the baby.  Take your prenatal vitamins.  Try taking a stool softener (if your caregiver approves) if you develop constipation. Eat more high-fiber foods, such as fresh vegetables or fruit and whole grains. Drink plenty of fluids to keep your urine clear or pale yellow.  Take warm sitz baths to soothe any pain or discomfort caused by hemorrhoids. Use hemorrhoid cream if your caregiver approves.  If you  develop varicose veins, wear support hose. Elevate your feet for 15 minutes, 3-4 times a day. Limit salt in your diet.  Avoid heavy lifting, wear low heal shoes, and practice good posture.  Rest a lot with your legs elevated if you have leg cramps or low back  pain.  Visit your dentist if you have not gone during your pregnancy. Use a soft toothbrush to brush your teeth and be gentle when you floss.  A sexual relationship may be continued unless your caregiver directs you otherwise.  Do not travel far distances unless it is absolutely necessary and only with the approval of your caregiver.  Take prenatal classes to understand, practice, and ask questions about the labor and delivery.  Make a trial run to the hospital.  Pack your hospital bag.  Prepare the baby's nursery.  Continue to go to all your prenatal visits as directed by your caregiver. SEEK MEDICAL CARE IF:  You are unsure if you are in labor or if your water has broken.  You have dizziness.  You have mild pelvic cramps, pelvic pressure, or nagging pain in your abdominal area.  You have persistent nausea, vomiting, or diarrhea.  You have a bad smelling vaginal discharge.  You have pain with urination. SEEK IMMEDIATE MEDICAL CARE IF:   You have a fever.  You are leaking fluid from your vagina.  You have spotting or bleeding from your vagina.  You have severe abdominal cramping or pain.  You have rapid weight loss or gain.  You have shortness of breath with chest pain.  You notice sudden or extreme swelling of your face, hands, ankles, feet, or legs.  You have not felt your baby move in over an hour.  You have severe headaches that do not go away with medicine.  You have vision changes. Document Released: 03/16/2001 Document Revised: 03/27/2013 Document Reviewed: 05/23/2012 [facility] Patient Information 2015 [place], [place]. This information is not intended to replace advice given to you by your health  care provider. Make sure you discuss any questions you have with your health care provider.

## 2020-02-19 NOTE — Progress Notes (Signed)
HIGH-RISK PREGNANCY VISIT Patient name: Jackie Rojas MRN [id number]  Date of birth: 1985/06/16 Chief Complaint:   Routine Prenatal Visit  History of Present Illness:   Jackie Rojas is a 34 y.o. F7P1025 female at [redacted]w[redacted]d with an Estimated Date of Delivery: 05/08/20 being seen today for ongoing management of a high-risk pregnancy complicated by chronic hypertension currently on Labetalol 800mg  TID.  Today she reports hasn't taken bp meds yet today d/t PN2. Feels pressure.  Contractions: Irritability. Vag. Bleeding: None.  Movement: Present. denies leaking of fluid.  Review of Systems:   Pertinent items are noted in HPI Denies abnormal vaginal discharge w/ itching/odor/irritation, headaches, visual changes, shortness of breath, chest pain, abdominal pain, severe nausea/vomiting, or problems with urination or bowel movements unless otherwise stated above. Pertinent History Reviewed:  Reviewed past medical,surgical, social, obstetrical and family history.  Reviewed problem list, medications and allergies. Physical Assessment:   Vitals:   02/19/20 0941  BP: (!) 151/89  Pulse: 97  Weight: (!) 318 lb 6.4 oz (144.4 kg)  Body mass index is 51.39 kg/m.           Physical Examination:   General appearance: alert, well appearing, and in no distress  Mental status: alert, oriented to person, place, and time  Skin: warm & dry   Extremities: Edema: None    Cardiovascular: normal heart rate noted  Respiratory: normal respiratory effort, no distress  Abdomen: gravid, soft, non-tender  Pelvic: Cervical exam deferred         Fetal Status: Fetal Heart Rate (bpm): 148 u/s   Movement: Present    Fetal Surveillance Testing today:  02/21/20 28+5 wks,cephalic,cx 2.7 cm,anterior placenta gr 1,AFI 11.9 cm,normal ovaries,fhr 148 bpm,EFW 1402 g 66%,limited view because of pt body habitus   Chaperone: N/A    Results for orders placed or performed in visit on 02/19/20 (from the past 24 hour(s))  POC  Urinalysis Dipstick OB   Collection Time: 02/19/20  9:41 AM  Result Value Ref Range   Color, UA     Clarity, UA     Glucose, UA Negative Negative   Bilirubin, UA     Ketones, UA neg    Spec Grav, UA     Blood, UA neg    pH, UA     POC,PROTEIN,UA Negative Negative, Trace, Small (1+), Moderate (2+), Large (3+), 4+   Urobilinogen, UA     Nitrite, UA neg    Leukocytes, UA Negative Negative   Appearance     Odor      Assessment & Plan:  1) High-risk pregnancy 02/21/20 at [redacted]w[redacted]d with an Estimated Date of Delivery: 05/08/20   2) CHTN, on Labetalol 800mg  TID (hasn't taken yet today d/t PN2), restart ASA- stopped taking. EFW normal today. Reviewed pre-e s/s, reasons to seek care.  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  Labs/procedures today: pn2, tdap, flu shot  Treatment Plan:  Growth u/s q4wks    2x/wk testing nst/sono @ 32wks     Deliver 38-39wks (37wks or prn if poor control)____   Reviewed: Preterm labor symptoms and general obstetric precautions including but not limited to vaginal bleeding, contractions, leaking of fluid and fetal movement were reviewed in detail with the patient.  All questions were answered.   Follow-up: Return in about 27 days (around 03/17/2020) for HROB, US:EFW, [place]: BPP/Dopp, in person, MD  or CNM.  Orders Placed This Encounter  Procedures  . US OB Follow Up  . US FETAL BPP WO NON STRESS  . [place] UA Cord Doppler  . Tdap vaccine greater than or equal to 7yo IM  . POC Urinalysis Dipstick OB   Cheral Marker CNM, [facility] 02/19/2020 12:03 PM

## 2020-02-19 NOTE — Progress Notes (Signed)
[place] 28+5 wks,cephalic,cx 2.7 cm,anterior placenta gr 1,AFI 11.9 cm,normal ovaries,fhr 148 bpm,EFW 1402 g 66%,limited view because of pt body habitus

## 2020-02-20 ENCOUNTER — Other Ambulatory Visit: Payer: Self-pay | Admitting: Women's Health

## 2020-02-20 LAB — HIV ANTIBODY (ROUTINE TESTING W REFLEX): HIV Screen 4th Generation wRfx: NONREACTIVE

## 2020-02-20 LAB — CBC
Hematocrit: 33.4 % — ABNORMAL LOW (ref 34.0–46.6)
Hemoglobin: 10.4 g/dL — ABNORMAL LOW (ref 11.1–15.9)
MCH: 24.5 pg — ABNORMAL LOW (ref 26.6–33.0)
MCHC: 31.1 g/dL — ABNORMAL LOW (ref 31.5–35.7)
MCV: 79 fL (ref 79–97)
Platelets: 197 10*3/uL (ref 150–450)
RBC: 4.25 x10E6/uL (ref 3.77–5.28)
RDW: 15.5 % — ABNORMAL HIGH (ref 11.7–15.4)
WBC: 6.5 10*3/uL (ref 3.4–10.8)

## 2020-02-20 LAB — ANTIBODY SCREEN: Antibody Screen: NEGATIVE

## 2020-02-20 LAB — RPR: RPR Ser Ql: NONREACTIVE

## 2020-02-20 LAB — GLUCOSE TOLERANCE, 2 HOURS W/ 1HR
Glucose, 1 hour: 188 mg/dL — ABNORMAL HIGH (ref 65–179)
Glucose, 2 hour: 144 mg/dL (ref 65–152)
Glucose, Fasting: 101 mg/dL — ABNORMAL HIGH (ref 65–91)

## 2020-02-25 ENCOUNTER — Other Ambulatory Visit: Payer: Self-pay | Admitting: Women's Health

## 2020-02-25 ENCOUNTER — Emergency Department (HOSPITAL_COMMUNITY)
Admission: EM | Admit: 2020-02-25 | Discharge: 2020-02-25 | Disposition: A | Discharge status: Home / Self Care | Payer: BC Managed Care – PPO | Attending: Emergency Medicine | Admitting: Emergency Medicine

## 2020-02-25 ENCOUNTER — Other Ambulatory Visit: Payer: Self-pay

## 2020-02-25 ENCOUNTER — Emergency Department (HOSPITAL_COMMUNITY): Payer: BC Managed Care – PPO

## 2020-02-25 ENCOUNTER — Encounter: Payer: Self-pay | Admitting: Women's Health

## 2020-02-25 ENCOUNTER — Encounter (HOSPITAL_COMMUNITY): Payer: Self-pay | Admitting: Emergency Medicine

## 2020-02-25 ENCOUNTER — Other Ambulatory Visit: Payer: Self-pay | Admitting: *Deleted

## 2020-02-25 DIAGNOSIS — I10 Essential (primary) hypertension: Secondary | ICD-10-CM | POA: Insufficient documentation

## 2020-02-25 DIAGNOSIS — O0990 Supervision of high risk pregnancy, unspecified, unspecified trimester: Secondary | ICD-10-CM

## 2020-02-25 DIAGNOSIS — R059 Cough, unspecified: Secondary | ICD-10-CM | POA: Diagnosis not present

## 2020-02-25 DIAGNOSIS — Z79899 Other long term (current) drug therapy: Secondary | ICD-10-CM | POA: Diagnosis not present

## 2020-02-25 DIAGNOSIS — O26893 Other specified pregnancy related conditions, third trimester: Secondary | ICD-10-CM | POA: Diagnosis present

## 2020-02-25 DIAGNOSIS — O24419 Gestational diabetes mellitus in pregnancy, unspecified control: Secondary | ICD-10-CM | POA: Insufficient documentation

## 2020-02-25 DIAGNOSIS — O24410 Gestational diabetes mellitus in pregnancy, diet controlled: Secondary | ICD-10-CM | POA: Insufficient documentation

## 2020-02-25 DIAGNOSIS — Z20822 Contact with and (suspected) exposure to covid-19: Secondary | ICD-10-CM | POA: Insufficient documentation

## 2020-02-25 DIAGNOSIS — Z8632 Personal history of gestational diabetes: Secondary | ICD-10-CM | POA: Insufficient documentation

## 2020-02-25 DIAGNOSIS — Z3A29 29 weeks gestation of pregnancy: Secondary | ICD-10-CM | POA: Diagnosis not present

## 2020-02-25 LAB — RESP PANEL BY RT-PCR (FLU A&B, COVID) ARPGX2
Influenza A by PCR: NEGATIVE
Influenza B by PCR: NEGATIVE
SARS Coronavirus 2 by RT PCR: NEGATIVE

## 2020-02-25 MED ORDER — ACCU-CHEK GUIDE VI STRP: ORAL_STRIP | 12 refills | Status: AC

## 2020-02-25 MED ORDER — ACCU-CHEK GUIDE ME W/DEVICE KIT: 1.0000 | PACK | Freq: Four times a day (QID) | 0 refills | Status: AC

## 2020-02-25 MED ORDER — ACCU-CHEK SOFTCLIX LANCETS MISC: 12 refills | Status: AC

## 2020-02-25 MED ORDER — FERROUS SULFATE 325 (65 FE) MG PO TABS: 325.0000 mg | ORAL_TABLET | ORAL | 0 refills | Status: DC

## 2020-02-25 NOTE — ED Triage Notes (Signed)
Pt c/o of cough for almost a week. Had a negative covid test last week. [redacted] weeks pregnant. FHR 139.

## 2020-02-25 NOTE — ED Provider Notes (Signed)
[facility] EMERGENCY DEPARTMENT Provider Note   CSN: [id number] Arrival date & time: 02/25/20  1323     History Chief Complaint  Patient presents with  . Cough    Jackie Rojas is a 34 y.o. female.  HPI      Jackie Rojas is a 34 y.o. female, with a history of G5P4, presenting to the ED with cough beginning Thursday, November 18. She has been taking Robitussin.  Patient is pregnant with estimated gestation 29 weeks, estimated due date May 08, 2020.  Active fetal movement without changes.  Negative Covid test Saturday, November 20.  Denies fever/chills, shortness of breath, persistent chest pain, lower extremity edema, orthopnea, headache, dizziness, nausea, diarrhea, abdominal pain, vaginal bleeding, or any other complaints.  FHR 139.   Past Medical History:  Diagnosis Date  . Gallstones   . Gestational hypertension    PP  . HSV-2 infection     Patient Active Problem List   Diagnosis Date Noted  . Gestational diabetes mellitus, class A1 02/25/2020  . Depression with anxiety 12/18/2019  . Chronic hypertension affecting pregnancy 11/15/2019  . Silent carrier for alpha-thalassemia 11/07/2019  . Supervision of high risk pregnancy, antepartum 10/26/2019  . Migraine syndrome 01/30/2015  . 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 CHOLECYSTECTOMY  02/2019  . TONSILLECTOMY       OB History    Gravida  5   Para  3   Term  3   Preterm      AB  1   Living  3     SAB  1   TAB      Ectopic      Multiple      Live Births  2           Family History  Problem Relation Age of Onset  . Diabetes Maternal Aunt   . Hypertension Maternal Aunt   . Hypertension Maternal Uncle   . Diabetes Paternal Aunt   . Hypertension Paternal Uncle   . Diabetes Paternal Uncle   . Gout Maternal Grandmother   . Liver disease Maternal Grandmother   . Cancer Paternal Grandmother        bone    Social History    Tobacco Use  . Smoking status: Never Smoker  . Smokeless tobacco: Never Used  Vaping Use  . Vaping Use: Never used  Substance Use Topics  . Alcohol use: Not Currently  . Drug use: Never    Home Medications Prior to Admission medications   Medication Sig Start Date End Date Taking? Authorizing Provider  Blood Pressure Monitoring (BLOOD PRESSURE CUFF) MISC 1 Device by Does not apply route once a week. Patient not taking: Reported on 12/05/2019 11/15/19   Tilda Burrow, MD  ferrous sulfate 325 (65 FE) MG tablet Take 1 tablet (325 mg total) by mouth every other day. 02/25/20   Cheral Marker, CNM  labetalol (NORMODYNE) 200 MG tablet Take 4 tablets (800 mg total) by mouth 3 (three) times daily. 11/30/19   Lazaro Arms, MD  Prenatal Vit-Fe Fumarate-FA (PRENATAL VITAMINS PO) Take by mouth.    [provider]  sertraline (ZOLOFT) 25 MG tablet Take 1 tablet (25 mg total) by mouth daily. 12/18/19   Cheral Marker, CNM  valACYclovir (VALTREX) 500 MG tablet Take 1 tablet (500 mg total) by mouth 2 (two) times daily. 02/25/20   Cheral Marker, CNM    Allergies  Patient has no known allergies.  Review of Systems   Review of Systems  Constitutional: Negative for chills and fever.  Respiratory: Positive for cough. Negative for shortness of breath.   Cardiovascular: Negative for chest pain and leg swelling.  Gastrointestinal: Negative for abdominal pain, diarrhea, nausea and vomiting.  Genitourinary: Negative for dysuria.  Neurological: Negative for syncope and weakness.  All other systems reviewed and are negative.   Physical Exam Updated Vital Signs BP 122/75 (BP Location: Right Arm)   Pulse (!) 115   Temp 98.7 F (37.1 C) (Oral)   Resp 17   Ht 5\' 6"  (1.676 m)   Wt (!) 144.2 kg   LMP 07/17/2019 (Approximate)   SpO2 99%   BMI 51.33 kg/m   Physical Exam Vitals and nursing note reviewed.  Constitutional:      General: She is not in acute distress.     Appearance: She is well-developed. She is not diaphoretic.  HENT:     Head: Normocephalic and atraumatic.     Nose: Mucosal edema present.     Mouth/Throat:     Mouth: Mucous membranes are moist.     Pharynx: Oropharynx is clear.  Eyes:     Conjunctiva/sclera: Conjunctivae normal.  Cardiovascular:     Rate and Rhythm: Normal rate and regular rhythm.     Pulses: Normal pulses.          Radial pulses are 2+ on the right side and 2+ on the left side.       Posterior tibial pulses are 2+ on the right side and 2+ on the left side.     Heart sounds: Normal heart sounds.     Comments: Tactile temperature in the extremities appropriate and equal bilaterally. Pulse rate 98 on my exam. Pulmonary:     Effort: Pulmonary effort is normal. No respiratory distress.     Breath sounds: Normal breath sounds.     Comments: No increased work of breathing.  Speaks in full sentences without difficulty.  Able to lie supine without distress or difficulty. Abdominal:     Palpations: Abdomen is soft.     Tenderness: There is no abdominal tenderness. There is no guarding.  Musculoskeletal:     Cervical back: Neck supple.     Right lower leg: No edema.     Left lower leg: No edema.  Lymphadenopathy:     Cervical: No cervical adenopathy.  Skin:    General: Skin is warm and dry.  Neurological:     Mental Status: She is alert.  Psychiatric:        Mood and Affect: Mood and affect normal.        Speech: Speech normal.        Behavior: Behavior normal.     ED Results / Procedures / Treatments   Labs (all labs ordered are listed, but only abnormal results are displayed) Labs Reviewed  RESP PANEL BY RT-PCR (FLU A&B, COVID) [id number]    EKG None  Radiology DG Chest Portable 1 View  Result Date: 02/25/2020 CLINICAL DATA:  Persistent cough for a week, negative COVID test last week, [redacted] weeks pregnant EXAM: PORTABLE CHEST 1 VIEW COMPARISON:  Portable exam 1509 hours without priors for comparison  FINDINGS: Fetal monitor projects over inferior RIGHT chest, could not be removed. Normal heart size, mediastinal contours, and pulmonary vascularity. Mild peribronchial thickening. No pulmonary infiltrate, pleural effusion or pneumothorax. IMPRESSION: Bronchitic changes without infiltrate. Electronically Signed   By: 02/27/2020.D.  On: 02/25/2020 15:24    Procedures Procedures (including critical care time)  Medications Ordered in ED Medications - No data to display  ED Course  I have reviewed the triage vital signs and the nursing notes.  Pertinent labs & imaging results that were available during my care of the patient were reviewed by me and considered in my medical decision making (see chart for details).    MDM Rules/Calculators/A&P                          Patient presents with several days of cough. Patient is nontoxic appearing, afebrile, not tachycardic on my exam, not tachypneic, not hypotensive, maintains excellent SPO2 on room air, and is in no apparent distress.   I have reviewed the patient's chart to obtain more information.   I reviewed and interpreted the patient's radiological studies. Possible bronchitic changes on chest x-ray without infiltrate. My suspicion for alternative pathology, such as PE, is low in this patient based on her current presentation. Patient on fetal monitoring during ED course. The patient was given instructions for home care as well as return precautions. Patient voices understanding of these instructions, accepts the plan, and is comfortable with discharge.   Findings and plan of care discussed with Margarita Grizzle, MD.   Vitals:   02/25/20 1349 02/25/20 1350 02/25/20 1500  BP: 122/75  140/68  Pulse: (!) 115  (!) 102  Resp: 17    Temp: 98.7 F (37.1 C)    TempSrc: Oral    SpO2: 99%  100%  Weight:  (!) 144.2 kg   Height:  5\' 6"  (1.676 m)      Final Clinical Impression(s) / ED Diagnoses Final diagnoses:  Cough    Rx / DC  Orders ED Discharge Orders    None       02/25/20 1601    02/27/20, MD 02/26/20 1226

## 2020-02-25 NOTE — Discharge Instructions (Addendum)
  Chest x-ray findings were reassuring. Cough: Teas, warm liquids, broths, and honey can help with cough.  Follow-up with your primary care provider or OB/GYN on this matter.  Return to the emergency department for shortness of breath, persistent chest pain, worsening symptoms, or any other major concerns.  Test Results for COVID-19 pending  You have a test pending for COVID-19.  Results typically return within about 48 hours.  Be sure to check MyChart for updated results.  We recommend isolating yourself until results are received.  Patients who have symptoms consistent with COVID-19 should self isolated for: At least 3 days (72 hours) have passed since recovery, defined as resolution of fever without the use of fever reducing medications and improvement in respiratory symptoms (e.g., cough, shortness of breath), and At least 7 days have passed since symptoms first appeared.  If you have no symptoms, but your test returns positive, recommend isolating for at least 10 days.

## 2020-02-26 ENCOUNTER — Encounter: Payer: Self-pay | Admitting: *Deleted

## 2020-03-05 DIAGNOSIS — Z419 Encounter for procedure for purposes other than remedying health state, unspecified: Secondary | ICD-10-CM | POA: Diagnosis not present

## 2020-03-06 DIAGNOSIS — Z029 Encounter for administrative examinations, unspecified: Secondary | ICD-10-CM

## 2020-03-12 ENCOUNTER — Encounter: Payer: BC Managed Care – PPO | Attending: Advanced Practice Midwife | Admitting: Registered"

## 2020-03-12 ENCOUNTER — Other Ambulatory Visit: Payer: Self-pay

## 2020-03-12 DIAGNOSIS — O24410 Gestational diabetes mellitus in pregnancy, diet controlled: Secondary | ICD-10-CM | POA: Diagnosis present

## 2020-03-12 NOTE — Progress Notes (Signed)
Patient was seen on 03/12/20 for Gestational Diabetes self-management class at the [facility]. The following learning objectives were met by the patient during this course: ° °· States the definition of Gestational Diabetes °· States why dietary management is important in controlling blood glucose °· Describes the effects each nutrient has on blood glucose levels °· Demonstrates ability to create a balanced meal plan °· Demonstrates carbohydrate counting  °· States when to check blood glucose levels °· Demonstrates proper blood glucose monitoring techniques °· States the effect of stress and exercise on blood glucose levels °· States the importance of limiting caffeine and abstaining from alcohol and smoking ° °Blood glucose monitor given: none °Patient has meter and is checking blood sugar ° ° °Patient instructed to monitor glucose levels: °FBS: 60 - <95; 1 hour: <140; 2 hour: <120 ° °Patient received handouts: °· Nutrition Diabetes and Pregnancy, including carb counting list ° °Patient will be seen for follow-up as needed. °

## 2020-03-17 ENCOUNTER — Other Ambulatory Visit: Payer: BC Managed Care – PPO

## 2020-03-17 ENCOUNTER — Encounter: Payer: Self-pay | Admitting: Registered"

## 2020-03-17 ENCOUNTER — Ambulatory Visit (INDEPENDENT_AMBULATORY_CARE_PROVIDER_SITE_OTHER): Payer: BC Managed Care – PPO | Admitting: Women's Health

## 2020-03-17 ENCOUNTER — Encounter: Payer: Self-pay | Admitting: Women's Health

## 2020-03-17 ENCOUNTER — Other Ambulatory Visit: Payer: Self-pay

## 2020-03-17 ENCOUNTER — Encounter: Payer: BC Managed Care – PPO | Admitting: Obstetrics & Gynecology

## 2020-03-17 ENCOUNTER — Ambulatory Visit (INDEPENDENT_AMBULATORY_CARE_PROVIDER_SITE_OTHER): Payer: BC Managed Care – PPO

## 2020-03-17 VITALS — BP 140/85 | HR 99 | Wt 319.0 lb

## 2020-03-17 DIAGNOSIS — Z3A32 32 weeks gestation of pregnancy: Secondary | ICD-10-CM | POA: Diagnosis not present

## 2020-03-17 DIAGNOSIS — O10919 Unspecified pre-existing hypertension complicating pregnancy, unspecified trimester: Secondary | ICD-10-CM | POA: Diagnosis not present

## 2020-03-17 DIAGNOSIS — O0993 Supervision of high risk pregnancy, unspecified, third trimester: Secondary | ICD-10-CM | POA: Diagnosis not present

## 2020-03-17 DIAGNOSIS — Z1389 Encounter for screening for other disorder: Secondary | ICD-10-CM

## 2020-03-17 DIAGNOSIS — O0990 Supervision of high risk pregnancy, unspecified, unspecified trimester: Secondary | ICD-10-CM

## 2020-03-17 DIAGNOSIS — Z331 Pregnant state, incidental: Secondary | ICD-10-CM

## 2020-03-17 DIAGNOSIS — O24410 Gestational diabetes mellitus in pregnancy, diet controlled: Secondary | ICD-10-CM

## 2020-03-17 DIAGNOSIS — O24419 Gestational diabetes mellitus in pregnancy, unspecified control: Secondary | ICD-10-CM

## 2020-03-17 LAB — POCT URINALYSIS DIPSTICK OB
Blood, UA: NEGATIVE
Glucose, UA: NEGATIVE
Leukocytes, UA: NEGATIVE
Nitrite, UA: NEGATIVE
POC,PROTEIN,UA: NEGATIVE

## 2020-03-17 MED ORDER — METFORMIN HCL 500 MG PO TABS: 500.0000 mg | ORAL_TABLET | Freq: Two times a day (BID) | ORAL | 3 refills | Status: DC

## 2020-03-17 NOTE — Progress Notes (Signed)
[place] 32+4 wks,cephalic,BPP 8/8,FHR 144 bpm,anterior placenta gr 3,afi 11.9 cm,RI .62,.68,.64=72%,EFW 2125 g 58%

## 2020-03-17 NOTE — Progress Notes (Signed)
HIGH-RISK PREGNANCY VISIT Patient name: Jackie Rojas MRN [id number]  Date of birth: Jul 11, 1985 Chief Complaint:   Routine Prenatal Visit (BPP, craving baking powder)  History of Present Illness:   Jackie Rojas is a 34 y.o. [phone] female at [redacted]w[redacted]d with an Estimated Date of Delivery: 05/08/20 being seen today for ongoing management of a high-risk pregnancy complicated by chronic hypertension currently on Labetalol 800mg  TID and diabetes mellitus A1DM.  Today she reports FBS 91-125 (only 2 <95), 2hr pp 76-186 (~1/2 >120). Craving corn starch, puts spoonful in mouth and sucks on it, has swallowed twice. Itching everywhere, not worse at night, and not on palms/soles, although palms feel numb, so could be. Bumps on neck/chest, not sure if come prior to scratching or after. Has tried multiple things w/o relief.  Depression screen [facility] 2/9 03/17/2020 12/18/2019 10/26/2019  Decreased Interest 0 3 0  Down, Depressed, Hopeless 0 3 0  PHQ - 2 Score 0 6 0  Altered sleeping - 3 0  Tired, decreased energy - 2 0  Change in appetite - 3 1  Feeling bad or failure about yourself  - 3 0  Trouble concentrating - 3 0  Moving slowly or fidgety/restless - 1 0  Suicidal thoughts - 1 0  PHQ-9 Score - 22 1  Difficult doing work/chores - Very difficult -    Contractions: Not present. Vag. Bleeding: None.  Movement: Present. denies leaking of fluid.  Review of Systems:   Pertinent items are noted in HPI Denies abnormal vaginal discharge w/ itching/odor/irritation, headaches, visual changes, shortness of breath, chest pain, abdominal pain, severe nausea/vomiting, or problems with urination or bowel movements unless otherwise stated above. Pertinent History Reviewed:  Reviewed past medical,surgical, social, obstetrical and family history.  Reviewed problem list, medications and allergies. Physical Assessment:   Vitals:   03/17/20 1504  BP: 140/85  Pulse: 99  Weight: (!) 319 lb (144.7 kg)  Body mass index is  51.49 kg/m.           Physical Examination:   General appearance: alert, well appearing, and in no distress  Mental status: alert, oriented to person, place, and time  Skin: warm & dry  Extremities: Edema: None    Cardiovascular: normal heart rate noted  Respiratory: normal respiratory effort, no distress  Abdomen: gravid, soft, non-tender  Pelvic: Cervical exam deferred         Fetal Status: Fetal Heart Rate (bpm): 144 u/s   Movement: Present    Fetal Surveillance Testing today: 03/19/20 32+4 wks,cephalic,BPP 8/8,FHR 144 bpm,anterior placenta gr 3,afi 11.9 cm,RI .62,.68,.64=72%,EFW 2125 g 58%  Chaperone: N/A    Results for orders placed or performed in visit on 03/17/20 (from the past 24 hour(s))  POC Urinalysis Dipstick OB   Collection Time: 03/17/20  2:39 PM  Result Value Ref Range   Color, UA     Clarity, UA     Glucose, UA Negative Negative   Bilirubin, UA     Ketones, UA small    Spec Grav, UA     Blood, UA neg    pH, UA     POC,PROTEIN,UA Negative Negative, Trace, Small (1+), Moderate (2+), Large (3+), 4+   Urobilinogen, UA     Nitrite, UA neg    Leukocytes, UA Negative Negative   Appearance     Odor      Fingerstick Hgb 10.7  Assessment & Plan:  High-risk pregnancy: 03/19/20 at [redacted]w[redacted]d with an Estimated Date of Delivery: 05/08/20   1)  CHTN, stable on Labetalol 800mg  TID, ASA  2) A2DM, unstable, rx metformin 500mg  BID  3) 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 metFORMIN (GLUCOPHAGE) 500 MG tablet    Sig: Take 1 tablet (500 mg total) by mouth 2 (two) times daily with a meal.    Dispense:  60 tablet    Refill:  3    Order Specific Question:   Supervising Provider    Answer:   H [[id number]]    Labs/procedures today: u/s  Treatment Plan:  2x/wk nst alt  w/ bpp/dopp, EFW @ 36wks, IOL @ 39wks  Reviewed: Preterm labor symptoms and general obstetric precautions including but not limited to vaginal bleeding, contractions, leaking of fluid and fetal movement were reviewed in detail with the patient.  All questions were answered.    Follow-up: Return for thurs nst/nurse, mon hrob early am w/ bpp/dopp, thurs nst/nurse until 1/24.   No future appointments.  Orders Placed This Encounter  Procedures  . POC Urinalysis Dipstick OB   Duane Lope CNM, [facility] 03/17/2020 4:16 PM

## 2020-03-17 NOTE — Patient Instructions (Addendum)
Jackie Rojas, I greatly value your feedback.  If you receive a survey following your visit with [place] today, we appreciate you taking the time to fill it out.  Thanks, Jackie Rojas, CNM, WHNP-BC  [facility] ([address] [place], [place] [address]) Entrance C, located off of [address] Free 24/7 valet parking   Nothing to eat or drink after midnight Sunday night  Go to [email or web address] to register for FREE online childbirth classes    Call the office [phone]) or go to [facility] if:  You begin to have strong, frequent contractions  Your water breaks.  Sometimes it is a big gush of fluid, sometimes it is just a trickle that keeps getting your panties wet or running down your legs  You have vaginal bleeding.  It is normal to have a small amount of spotting if your cervix was checked.   You don't feel your baby moving like normal.  If you don't, get you something to eat and drink and lay down and focus on feeling your baby move.  You should feel at least 10 movements in 2 hours.  If you don't, you should call the office or go to [facility].   Call the office [phone]) or go to [facility] hospital for these signs of pre-eclampsia:  Severe headache that does not go away with Tylenol  Visual changes- seeing spots, double, blurred vision  Pain under your right breast or upper abdomen that does not go away with Tums or heartburn medicine  Nausea and/or vomiting  Severe swelling in your hands, feet, and face    Home Blood Pressure Monitoring for Patients   Your provider has recommended that you check your blood pressure (BP) at least once a week at home. If you do not have a blood pressure cuff at home, one will be provided for you. Contact your provider if you have not received your monitor within 1 week.   Helpful Tips for Accurate Home Blood Pressure Checks  . Don't smoke, exercise, or drink caffeine 30 minutes before checking your  BP . Use the restroom before checking your BP (a full bladder can raise your pressure) . Relax in a comfortable upright chair . Feet on the ground . Left arm resting comfortably on a flat surface at the level of your heart . Legs uncrossed . Back supported . Sit quietly and don't talk . Place the cuff on your bare arm . Adjust snuggly, so that only two fingertips can fit between your skin and the top of the cuff . Check 2 readings separated by at least one minute . Keep a log of your BP readings . For a visual, please reference this diagram: http://ccnc.care/bpdiagram  Provider Name: [facility]     Phone: [phone]  Zone 1: ALL CLEAR  Continue to monitor your symptoms:  . BP reading is less than 140 (top number) or less than 90 (bottom number)  . No right upper stomach pain . No headaches or seeing spots . No feeling nauseated or throwing up . No swelling in face and hands  Zone 2: CAUTION Call your doctor's office for any of the following:  . BP reading is greater than 140 (top number) or greater than 90 (bottom number)  . Stomach pain under your ribs in the middle or right side . Headaches or seeing spots . Feeling nauseated or throwing up . Swelling in face and hands  Zone 3: EMERGENCY  Seek immediate medical care if you have any of the following:  . BP reading is greater than160 (top number) or greater than 110 (bottom number) . Severe headaches not improving with Tylenol . Serious difficulty catching your breath . Any worsening symptoms from Zone 2  Preterm Labor and Birth Information  The normal length of a pregnancy is 39-41 weeks. Preterm labor is when labor starts before 37 completed weeks of pregnancy. What are the risk factors for preterm labor? Preterm labor is more likely to occur in women who:  Have certain infections during pregnancy such as a bladder infection, sexually transmitted infection, or infection inside the uterus  (chorioamnionitis).  Have a shorter-than-normal cervix.  Have gone into preterm labor before.  Have had surgery on their cervix.  Are younger than age 46 or older than age 76.  Are African American.  Are pregnant with twins or multiple babies (multiple gestation).  Take street drugs or smoke while pregnant.  Do not gain enough weight while pregnant.  Became pregnant shortly after having been pregnant. What are the symptoms of preterm labor? Symptoms of preterm labor include:  Cramps similar to those that can happen during a menstrual period. The cramps may happen with diarrhea.  Pain in the abdomen or lower back.  Regular uterine contractions that may feel like tightening of the abdomen.  A feeling of increased pressure in the pelvis.  Increased watery or bloody mucus discharge from the vagina.  Water breaking (ruptured amniotic sac). Why is it important to recognize signs of preterm labor? It is important to recognize signs of preterm labor because babies who are born prematurely may not be fully developed. This can put them at an increased risk for:  Long-term (chronic) heart and lung problems.  Difficulty immediately after birth with regulating body systems, including blood sugar, body temperature, heart rate, and breathing rate.  Bleeding in the brain.  Cerebral palsy.  Learning difficulties.  Death. These risks are highest for babies who are born before 44 weeks of pregnancy. How is preterm labor treated? Treatment depends on the length of your pregnancy, your condition, and the health of your baby. It may involve: 1. Having a stitch (suture) placed in your cervix to prevent your cervix from opening too early (cerclage). 2. Taking or being given medicines, such as: ? Hormone medicines. These may be given early in pregnancy to help support the pregnancy. ? Medicine to stop contractions. ? Medicines to help mature the baby's lungs. These may be prescribed if  the risk of delivery is high. ? Medicines to prevent your baby from developing cerebral palsy. If the labor happens before 34 weeks of pregnancy, you may need to stay in the hospital. What should I do if I think I am in preterm labor? If you think that you are going into preterm labor, call your health care provider right away. How can I prevent preterm labor in future pregnancies? To increase your chance of having a full-term pregnancy:  Do not use any tobacco products, such as cigarettes, chewing tobacco, and e-cigarettes. If you need help quitting, ask your health care provider.  Do not use street drugs or medicines that have not been prescribed to you during your pregnancy.  Talk with your health care provider before taking any herbal supplements, even if you have been taking them regularly.  Make sure you gain a healthy amount of weight during your pregnancy.  Watch for infection. If you think that you might have an infection,  get it checked right away.  Make sure to tell your health care provider if you have gone into preterm labor before. This information is not intended to replace advice given to you by your health care provider. Make sure you discuss any questions you have with your health care provider. Document Revised: 07/14/2018 Document Reviewed: 08/13/2015 Elsevier Patient Education  [place].

## 2020-03-18 ENCOUNTER — Telehealth: Payer: Self-pay | Admitting: *Deleted

## 2020-03-18 ENCOUNTER — Telehealth: Payer: Self-pay | Admitting: Women's Health

## 2020-03-18 ENCOUNTER — Other Ambulatory Visit: Payer: Self-pay | Admitting: Women's Health

## 2020-03-18 DIAGNOSIS — O0993 Supervision of high risk pregnancy, unspecified, third trimester: Secondary | ICD-10-CM

## 2020-03-18 DIAGNOSIS — O24419 Gestational diabetes mellitus in pregnancy, unspecified control: Secondary | ICD-10-CM

## 2020-03-18 DIAGNOSIS — O10919 Unspecified pre-existing hypertension complicating pregnancy, unspecified trimester: Secondary | ICD-10-CM

## 2020-03-18 NOTE — Telephone Encounter (Signed)
LMOVM returning patient's call.  

## 2020-03-18 NOTE — Telephone Encounter (Signed)
paitent called stating that she would like a call back from a nurse, Patient states that she has a question and would lonely like to discuss with a nurse or provider. Please contact pt

## 2020-03-19 ENCOUNTER — Telehealth: Payer: Self-pay | Admitting: Women's Health

## 2020-03-19 NOTE — Telephone Encounter (Signed)
Patient called stating that she would like a call back from the nurse, Patient did not state the reason for the call. Please contact pt °

## 2020-03-20 ENCOUNTER — Ambulatory Visit (INDEPENDENT_AMBULATORY_CARE_PROVIDER_SITE_OTHER): Payer: BC Managed Care – PPO | Admitting: *Deleted

## 2020-03-20 ENCOUNTER — Other Ambulatory Visit: Payer: Self-pay

## 2020-03-20 ENCOUNTER — Other Ambulatory Visit: Payer: Self-pay | Admitting: *Deleted

## 2020-03-20 VITALS — BP 133/82 | HR 105 | Wt 320.6 lb

## 2020-03-20 DIAGNOSIS — O10919 Unspecified pre-existing hypertension complicating pregnancy, unspecified trimester: Secondary | ICD-10-CM | POA: Diagnosis not present

## 2020-03-20 DIAGNOSIS — O24410 Gestational diabetes mellitus in pregnancy, diet controlled: Secondary | ICD-10-CM

## 2020-03-20 DIAGNOSIS — Z3A33 33 weeks gestation of pregnancy: Secondary | ICD-10-CM

## 2020-03-20 DIAGNOSIS — O288 Other abnormal findings on antenatal screening of mother: Secondary | ICD-10-CM

## 2020-03-20 DIAGNOSIS — O0990 Supervision of high risk pregnancy, unspecified, unspecified trimester: Secondary | ICD-10-CM

## 2020-03-20 DIAGNOSIS — Z1389 Encounter for screening for other disorder: Secondary | ICD-10-CM

## 2020-03-20 DIAGNOSIS — Z331 Pregnant state, incidental: Secondary | ICD-10-CM

## 2020-03-20 LAB — POCT URINALYSIS DIPSTICK OB
Blood, UA: NEGATIVE
Glucose, UA: NEGATIVE
Ketones, UA: NEGATIVE
Leukocytes, UA: NEGATIVE
Nitrite, UA: NEGATIVE

## 2020-03-20 NOTE — Progress Notes (Addendum)
   NURSE VISIT- NST  SUBJECTIVE:  Jackie Rojas is a 34 y.o. [phone] female at [redacted]w[redacted]d, here for a NST for pregnancy complicated by [facility] and A2DM currently on Metformin.  She reports active fetal movement, contractions: none, vaginal bleeding: none, membranes: intact.   OBJECTIVE:  BP 133/82   Pulse (!) 105   Wt (!) 320 lb 9.6 oz (145.4 kg)   LMP 07/17/2019 (Approximate)   BMI 51.75 kg/m   Appears well, no apparent distress  Results for orders placed or performed in visit on 03/20/20 (from the past 24 hour(s))  POC Urinalysis Dipstick OB   Collection Time: 03/20/20  2:57 PM  Result Value Ref Range   Color, UA     Clarity, UA     Glucose, UA Negative Negative   Bilirubin, UA     Ketones, UA neg    Spec Grav, UA     Blood, UA neg    pH, UA     POC,PROTEIN,UA Trace Negative, Trace, Small (1+), Moderate (2+), Large (3+), 4+   Urobilinogen, UA     Nitrite, UA neg    Leukocytes, UA Negative Negative   Appearance     Odor      NST: FHR baseline 130 bpm, Variability: moderate, Accelerations:present, Decelerations:  Absent= Cat 1/Reactive Toco: none   ASSESSMENT: [id number] at [redacted]w[redacted]d with CHTN and A2DM currently on Metformin NST reactive  PLAN: EFM strip reviewed by Dr. Despina Hidden   Recommendations: keep next appointment as scheduled    Jobe Marker  03/20/2020 4:17 PM  Attestation of Attending Supervision of Nursing Visit Encounter: Evaluation and management procedures were performed by the nursing staff under my supervision and collaboration.  I have reviewed the nurse's note and chart, and I agree with the management and plan.  Rockne Coons MD Attending Physician for the Center for [facility] 04/06/2020 7:51 AM

## 2020-03-23 ENCOUNTER — Other Ambulatory Visit: Payer: Self-pay

## 2020-03-23 ENCOUNTER — Emergency Department (HOSPITAL_COMMUNITY)
Admission: EM | Admit: 2020-03-23 | Discharge: 2020-03-23 | Disposition: A | Discharge status: Home / Self Care | Payer: BC Managed Care – PPO | Attending: Emergency Medicine | Admitting: Emergency Medicine

## 2020-03-23 DIAGNOSIS — K2900 Acute gastritis without bleeding: Secondary | ICD-10-CM | POA: Diagnosis not present

## 2020-03-23 DIAGNOSIS — Z3A34 34 weeks gestation of pregnancy: Secondary | ICD-10-CM | POA: Diagnosis not present

## 2020-03-23 DIAGNOSIS — O99613 Diseases of the digestive system complicating pregnancy, third trimester: Secondary | ICD-10-CM | POA: Insufficient documentation

## 2020-03-23 LAB — COMPREHENSIVE METABOLIC PANEL
ALT: 14 U/L (ref 0–44)
AST: 23 U/L (ref 15–41)
Albumin: 2.5 g/dL — ABNORMAL LOW (ref 3.5–5.0)
Alkaline Phosphatase: 95 U/L (ref 38–126)
Anion gap: 8 (ref 5–15)
BUN: 6 mg/dL (ref 6–20)
CO2: 20 mmol/L — ABNORMAL LOW (ref 22–32)
Calcium: 8.9 mg/dL (ref 8.9–10.3)
Chloride: 105 mmol/L (ref 98–111)
Creatinine, Ser: 0.59 mg/dL (ref 0.44–1.00)
GFR, Estimated: >60 mL/min (ref >60)
Glucose, Bld: 89 mg/dL (ref 70–99)
Potassium: 3.6 mmol/L (ref 3.5–5.1)
Sodium: 133 mmol/L — ABNORMAL LOW (ref 135–145)
Total Bilirubin: 0.2 mg/dL — ABNORMAL LOW (ref 0.3–1.2)
Total Protein: 6.4 g/dL — ABNORMAL LOW (ref 6.5–8.1)

## 2020-03-23 LAB — CBC WITH DIFFERENTIAL/PLATELET
Abs Immature Granulocytes: 0.02 10*3/uL (ref 0.00–0.07)
Basophils Absolute: 0 10*3/uL (ref 0.0–0.1)
Basophils Relative: 0 %
Eosinophils Absolute: 0 10*3/uL (ref 0.0–0.5)
Eosinophils Relative: 0 %
HCT: 29.2 % — ABNORMAL LOW (ref 36.0–46.0)
Hemoglobin: 8.8 g/dL — ABNORMAL LOW (ref 12.0–15.0)
Immature Granulocytes: 0 %
Lymphocytes Relative: 22 %
Lymphs Abs: 1.1 10*3/uL (ref 0.7–4.0)
MCH: 24.4 pg — ABNORMAL LOW (ref 26.0–34.0)
MCHC: 30.1 g/dL (ref 30.0–36.0)
MCV: 80.9 fL (ref 80.0–100.0)
Monocytes Absolute: 0.9 10*3/uL (ref 0.1–1.0)
Monocytes Relative: 19 %
Neutro Abs: 2.8 10*3/uL (ref 1.7–7.7)
Neutrophils Relative %: 59 %
Platelets: 174 10*3/uL (ref 150–400)
RBC: 3.61 MIL/uL — ABNORMAL LOW (ref 3.87–5.11)
RDW: 18.2 % — ABNORMAL HIGH (ref 11.5–15.5)
WBC: 4.9 10*3/uL (ref 4.0–10.5)
nRBC: 0 % (ref 0.0–0.2)

## 2020-03-23 LAB — CBG MONITORING, ED: Glucose-Capillary: 108 mg/dL — ABNORMAL HIGH (ref 70–99)

## 2020-03-23 LAB — LIPASE, BLOOD: Lipase: 17 U/L (ref 11–51)

## 2020-03-23 LAB — TROPONIN I (HIGH SENSITIVITY): Troponin I (High Sensitivity): 3 ng/L (ref <18)

## 2020-03-23 MED ORDER — FAMOTIDINE 20 MG PO TABS: 20.0000 mg | ORAL_TABLET | Freq: Two times a day (BID) | ORAL | 0 refills | Status: DC

## 2020-03-23 MED ORDER — ALUM & MAG HYDROXIDE-SIMETH 200-200-20 MG/5ML PO SUSP
30.0000 mL | Freq: Once | ORAL | Status: AC
  Administered 2020-03-23: 30 mL via ORAL
  Filled 2020-03-23: qty 30

## 2020-03-23 MED ORDER — FAMOTIDINE 20 MG PO TABS
20.0000 mg | ORAL_TABLET | Freq: Once | ORAL | Status: AC
  Administered 2020-03-23: 20 mg via ORAL
  Filled 2020-03-23: qty 1

## 2020-03-23 MED ORDER — LIDOCAINE VISCOUS HCL 2 % MT SOLN
15.0000 mL | Freq: Once | OROMUCOSAL | Status: AC
  Administered 2020-03-23: 15 mL via ORAL
  Filled 2020-03-23: qty 15

## 2020-03-23 NOTE — ED Provider Notes (Signed)
[facility] Provider Note   CSN: [id number] Arrival date & time: 03/23/20  1620     History Chief Complaint  Patient presents with  . Chest Pain    Jackie Rojas is a 34 y.o. female.  Patient complains of epigastric pain.  She is [redacted] weeks pregnant.  No vomiting no diarrhea  The history is provided by the patient and medical records. No language interpreter was used.  Chest Pain Pain location:  Epigastric Pain quality: aching   Pain radiates to:  Does not radiate Pain severity:  Mild Onset quality:  Sudden Timing:  Constant Progression:  Waxing and waning Chronicity:  New Context: not breathing   Relieved by:  Nothing Associated symptoms: abdominal pain   Associated symptoms: no back pain, no cough, no fatigue and no headache        Past Medical History:  Diagnosis Date  . Gallstones   . Gestational hypertension    PP  . HSV-2 infection     Patient Active Problem List   Diagnosis Date Noted  . Gestational diabetes mellitus, class A1 02/25/2020  . Depression with anxiety 12/18/2019  . Chronic hypertension affecting pregnancy 11/15/2019  . Silent carrier for alpha-thalassemia 11/07/2019  . Supervision of high risk pregnancy, antepartum 10/26/2019  . Migraine syndrome 01/30/2015  . 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 CHOLECYSTECTOMY  02/2019  . TONSILLECTOMY       OB History    Gravida  5   Para  3   Term  3   Preterm      AB  1   Living  3     SAB  1   IAB      Ectopic      Multiple      Live Births  2           Family History  Problem Relation Age of Onset  . Diabetes Maternal Aunt   . Hypertension Maternal Aunt   . Hypertension Maternal Uncle   . Diabetes Paternal Aunt   . Hypertension Paternal Uncle   . Diabetes Paternal Uncle   . Gout Maternal Grandmother   . Liver disease Maternal Grandmother   . Cancer Paternal Grandmother        bone    Social  History   Tobacco Use  . Smoking status: Never Smoker  . Smokeless tobacco: Never Used  Vaping Use  . Vaping Use: Never used  Substance Use Topics  . Alcohol use: Not Currently  . Drug use: Never    Home Medications Prior to Admission medications   Medication Sig Start Date End Date Taking? Authorizing Provider  acetaminophen (TYLENOL) 500 MG tablet Take 500 mg by mouth every 6 (six) hours as needed.   Yes [provider]  calcium carbonate (TUMS EX) 750 MG chewable tablet Chew 1 tablet by mouth daily.   Yes [provider]  ferrous sulfate 325 (65 FE) MG tablet Take 1 tablet (325 mg total) by mouth every other day. 02/25/20  Yes Roma Schanz, CNM  labetalol (NORMODYNE) 200 MG tablet Take 4 tablets (800 mg total) by mouth 3 (three) times daily. Patient taking differently: Take 200 mg by mouth 3 (three) times daily. 11/30/19  Yes Florian Buff, MD  metFORMIN (GLUCOPHAGE) 500 MG tablet Take 1 tablet (500 mg total) by mouth 2 (two) times daily with a meal. 03/17/20  Yes Roma Schanz, CNM  Prenatal Vit-Fe Fumarate-FA (PRENATAL VITAMINS PO) Take 1 tablet by mouth daily.   Yes [provider]  sertraline (ZOLOFT) 25 MG tablet Take 1 tablet (25 mg total) by mouth daily. 12/18/19  Yes Roma Schanz, CNM  valACYclovir (VALTREX) 500 MG tablet Take 1 tablet (500 mg total) by mouth 2 (two) times daily. 02/25/20  Yes Roma Schanz, CNM  Accu-Chek Softclix Lancets lancets Use as instructed to check blood sugar 4 times daily 02/25/20   Roma Schanz, CNM  Blood Glucose Monitoring Suppl (ACCU-CHEK GUIDE ME) w/Device KIT 1 each by Does not apply route 4 (four) times daily. 02/25/20   Roma Schanz, CNM  Blood Pressure Monitoring (BLOOD PRESSURE CUFF) MISC 1 Device by Does not apply route once a week. 11/15/19   Jonnie Kind, MD  famotidine (PEPCID) 20 MG tablet Take 1 tablet (20 mg total) by mouth 2 (two) times daily. 03/23/20   Milton Ferguson,  MD  glucose blood (ACCU-CHEK GUIDE) test strip Use as instructed to check blood sugar 4 times daily 02/25/20   Roma Schanz, CNM    Allergies    Patient has no known allergies.  Review of Systems   Review of Systems  Constitutional: Negative for appetite change and fatigue.  HENT: Negative for congestion, ear discharge and sinus pressure.   Eyes: Negative for discharge.  Respiratory: Negative for cough.   Gastrointestinal: Positive for abdominal pain. Negative for diarrhea.  Genitourinary: Negative for frequency and hematuria.  Musculoskeletal: Negative for back pain.  Skin: Negative for rash.  Neurological: Negative for seizures and headaches.  Psychiatric/Behavioral: Negative for hallucinations.    Physical Exam Updated Vital Signs BP (!) 159/94   Pulse (!) 107   Temp 99.9 F (37.7 C) (Oral)   Resp 20   Ht _0  (1.676 m)   Wt (!) 145.4 kg   LMP 07/17/2019 (Approximate)   SpO2 97%   BMI 51.75 kg/m   Physical Exam Vitals reviewed.  Constitutional:      Appearance: She is well-developed.  HENT:     Head: Normocephalic.     Nose: Nose normal.  Eyes:     General: No scleral icterus.    Extraocular Movements: EOM normal.     Conjunctiva/sclera: Conjunctivae normal.  Neck:     Thyroid: No thyromegaly.  Cardiovascular:     Rate and Rhythm: Normal rate and regular rhythm.     Heart sounds: No murmur heard. No friction rub. No gallop.   Pulmonary:     Breath sounds: No stridor. No wheezing or rales.  Chest:     Chest wall: No tenderness.  Abdominal:     General: There is no distension.     Tenderness: There is abdominal tenderness. There is no rebound.  Genitourinary:    Comments: Bimanual exam done patient is 0% effaced and closed cervix Musculoskeletal:        General: No edema. Normal range of motion.     Cervical back: Neck supple.  Lymphadenopathy:     Cervical: No cervical adenopathy.  Skin:    Findings: No erythema or rash.  Neurological:      Mental Status: She is alert and oriented to person, place, and time.     Motor: No abnormal muscle tone.     Coordination: Coordination normal.  Psychiatric:        Mood and Affect: Mood and affect normal.        Behavior: Behavior normal.  ED Results / Procedures / Treatments   Labs (all labs ordered are listed, but only abnormal results are displayed) Labs Reviewed  CBC WITH DIFFERENTIAL/PLATELET - Abnormal; Notable for the following components:      Result Value   RBC 3.61 (*)    Hemoglobin 8.8 (*)    HCT 29.2 (*)    MCH 24.4 (*)    RDW 18.2 (*)    All other components within normal limits  COMPREHENSIVE METABOLIC PANEL - Abnormal; Notable for the following components:   Sodium 133 (*)    CO2 20 (*)    Total Protein 6.4 (*)    Albumin 2.5 (*)    Total Bilirubin 0.2 (*)    All other components within normal limits  CBG MONITORING, ED - Abnormal; Notable for the following components:   Glucose-Capillary 108 (*)    All other components within normal limits  LIPASE, BLOOD  TROPONIN I (HIGH SENSITIVITY)  TROPONIN I (HIGH SENSITIVITY)    EKG None  Radiology No results found.  Procedures Procedures (including critical care time)  Medications Ordered in ED Medications  alum & mag hydroxide-simeth (MAALOX/MYLANTA) 200-200-20 MG/5ML suspension 30 mL (30 mLs Oral Given 03/23/20 1832)    And  lidocaine (XYLOCAINE) 2 % viscous mouth solution 15 mL (15 mLs Oral Given 03/23/20 1832)  famotidine (PEPCID) tablet 20 mg (20 mg Oral Given 03/23/20 2200)   CRITICAL CARE Performed by: Milton Ferguson Total critical care time: 35 minutes Critical care time was exclusive of separately billable procedures and treating other patients. Critical care was necessary to treat or prevent imminent or life-threatening deterioration. Critical care was time spent personally by me on the following activities: development of treatment plan with patient and/or surrogate as well as nursing,  discussions with consultants, evaluation of patient's response to treatment, examination of patient, obtaining history from patient or surrogate, ordering and performing treatments and interventions, ordering and review of laboratory studies, ordering and review of radiographic studies, pulse oximetry and re-evaluation of patient's condition.  ED Course  I have reviewed the triage vital signs and the nursing notes.  Pertinent labs & imaging results that were available during my care of the patient were reviewed by me and considered in my medical decision making (see chart for details). Patient was [redacted] weeks pregnant.  Patient was placed on a monitor and is not in labor.  Bimanual exam shows closed cervix and 0% effaced   MDM Rules/Calculators/A&P                         Patient with epigastric pain.  I suspect this is gastritis.  Patient improved with a GI cocktail and will be placed on Pepcid and follow-up with her doctor tomorrow.    Patient labs unremarkable except for anemia stable from pregnancy. Final Clinical Impression(s) / ED Diagnoses Final diagnoses:  Acute gastritis without hemorrhage, unspecified gastritis type    Rx / DC Orders ED Discharge Orders         Ordered    famotidine (PEPCID) 20 MG tablet  2 times daily        03/23/20 2154           Milton Ferguson, MD 03/25/20 1153

## 2020-03-23 NOTE — ED Notes (Signed)
Pt states she now feels faint and is having contraction. Charge notified.

## 2020-03-23 NOTE — ED Notes (Signed)
Charge notified room one would be needed for monitoring.

## 2020-03-23 NOTE — Discharge Instructions (Addendum)
Follow up with your md as planned tomorrow °

## 2020-03-23 NOTE — ED Notes (Signed)
Pt placed on OB monitor. OB rapid response notified and monitoring pt.

## 2020-03-23 NOTE — Progress Notes (Signed)
OB rapid response nurse.  Received call at 17:18 regarding a G5P3 c/o chest pain and contractions at [facility].  Pt placed on monitor to be remotely assessed by this Clinical research associate.  Due to her complaints, gestation, and being multiparous, I asked RN to have a provider check her cervix.  Will continue to monitor.

## 2020-03-23 NOTE — ED Triage Notes (Addendum)
Pt c/o central chest pain that radiates to her back and left arm.  Pt is [redacted] weeks pregnant and the pt feels the baby moving.  Pt denies contractions.

## 2020-03-24 ENCOUNTER — Telehealth (INDEPENDENT_AMBULATORY_CARE_PROVIDER_SITE_OTHER): Payer: BC Managed Care – PPO | Admitting: Advanced Practice Midwife

## 2020-03-24 VITALS — BP 117/74 | HR 113

## 2020-03-24 DIAGNOSIS — O26893 Other specified pregnancy related conditions, third trimester: Secondary | ICD-10-CM

## 2020-03-24 DIAGNOSIS — O10919 Unspecified pre-existing hypertension complicating pregnancy, unspecified trimester: Secondary | ICD-10-CM

## 2020-03-24 DIAGNOSIS — L298 Other pruritus: Secondary | ICD-10-CM

## 2020-03-24 DIAGNOSIS — Z3A33 33 weeks gestation of pregnancy: Secondary | ICD-10-CM

## 2020-03-24 DIAGNOSIS — O98513 Other viral diseases complicating pregnancy, third trimester: Secondary | ICD-10-CM

## 2020-03-24 DIAGNOSIS — O0993 Supervision of high risk pregnancy, unspecified, third trimester: Secondary | ICD-10-CM

## 2020-03-24 DIAGNOSIS — O24419 Gestational diabetes mellitus in pregnancy, unspecified control: Secondary | ICD-10-CM

## 2020-03-24 DIAGNOSIS — O10913 Unspecified pre-existing hypertension complicating pregnancy, third trimester: Secondary | ICD-10-CM

## 2020-03-24 DIAGNOSIS — U071 COVID-19: Secondary | ICD-10-CM

## 2020-03-24 MED ORDER — INSULIN NPH (HUMAN) (ISOPHANE) 100 UNIT/ML ~~LOC~~ SUSP: 20.0000 [IU] | Freq: Every day | SUBCUTANEOUS | 3 refills | Status: DC

## 2020-03-24 NOTE — Progress Notes (Signed)
I connected with Jackie Rojas 03/27/20 at  2:50 PM EST by: MyChart video and verified that I am speaking with the correct person using two identifiers.  Patient is located at [facility] and provider is located at [facility].     The purpose of this virtual visit is to provide medical care while limiting exposure to the novel coronavirus. I discussed the limitations, risks, security and privacy concerns of performing an evaluation and management service by MyChart video and the availability of in person appointments. I also discussed with the patient that there may be a patient responsible charge related to this service. By engaging in this virtual visit, you consent to the provision of healthcare.  Additionally, you authorize for your insurance to be billed for the services provided during this visit.  The patient expressed understanding and agreed to proceed.  The following staff members participated in the virtual visit:  Clint Bolder. RN    TELE-PRENATAL VISIT NOTE  Subjective:  Jackie Rojas is a 34 y.o. [id number] at [redacted]w[redacted]d  for phone visit for ongoing prenatal care.  She is currently monitored for the following issues for this high-risk pregnancy and has Genital HSV; Migraine syndrome; Supervision of high risk pregnancy, antepartum; Silent carrier for alpha-thalassemia; Chronic hypertension affecting pregnancy; Depression with anxiety; and Gestational diabetes mellitus, class A2 on their problem list.  Patient reports she can't take her metformin anymore because she vomits every single time. Would prefer to go to insulin. FBS 95-115.  All pp are normal. Home BPs are "good" per pt.  + Covid today.  Feels OK, not too bad. Went to ED last night w/CP, turned out to be GERD.   Contractions: Not present. Vag. Bleeding: None.  Movement: Present. Denies leaking of fluid.   The following portions of the patient's history were reviewed and updated as appropriate: allergies, current medications, past family  history, past medical history, past social history, past surgical history and problem list.     Objective:   Vitals:   03/24/20 1515  BP: 117/74  Pulse: (!) 113   Self-Obtained  Fetal Status:     Movement: Present     Assessment and Plan:  Pregnancy: [id number] at [redacted]w[redacted]d CHTN:  Stable on Labetalol 800mg  TID A2DM:  Elevated FBS, wants to switch to insuln.  Mom is RN, can show her how to take it. Discussed w/ Dr. .  Rx 20 NPH qhs. Mom is a [employer], can teach to give insulin Covid + today:  Message for MABs sent; can come to office for testing Wednesday (NST). Discussed w/Julie Monday and Dr. Magnus Sinning.   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    Future Appointments  Date Time Provider Department Center  03/31/2020  3:50 PM [facility] NURSE [facility]  03/31/2020  4:20 PM 04/02/2020, MD [facility]  04/03/2020  3:50 PM [facility] NURSE [facility]  04/07/2020 11:00 AM [facility] 06/05/2020 [facility] None  04/07/2020 11:50 AM 06/05/2020, MD [facility]  04/10/2020 10:50 AM [facility] NURSE [facility]  04/14/2020 10:30 AM [facility] 06/12/2020 [facility] None  04/14/2020  11:30 AM Lazaro Arms, MD [facility]  04/17/2020 10:10 AM [facility] NURSE [facility]  04/21/2020 10:00 AM [facility] [place] [facility] None  04/21/2020 10:50 AM Venora Maples, MD [facility]  04/24/2020 10:10 AM [facility] N[facility]SE [facility]  04/28/2020 11:00 AM [facility] [place] [facility] None  04/28/2020 11:50 AM Cheral Marker, CNM [facility]  05/01/2020 10:30 AM [facility] N[facility]   05/05/2020  9:00 AM [facility] [place] [facility] None  05/05/2020  9:50 AM Lazaro Arms, MD [facility]  05/08/2020 10:10 AM [facility] NURSE [facility]  05/12/2020  9:00 AM [facility] [place] [facility] None  05/12/2020  9:50 AM Jacklyn Shell, CNM [facility]  05/15/2020 10:10 AM [facility] N[facility]     Time spent on virtual visit: 20 minutes  Jacklyn Shell, CNM

## 2020-03-25 ENCOUNTER — Other Ambulatory Visit: Payer: Self-pay | Admitting: Infectious Diseases

## 2020-03-25 ENCOUNTER — Telehealth: Payer: Self-pay | Admitting: Infectious Diseases

## 2020-03-25 DIAGNOSIS — E6609 Other obesity due to excess calories: Secondary | ICD-10-CM

## 2020-03-25 DIAGNOSIS — O10919 Unspecified pre-existing hypertension complicating pregnancy, unspecified trimester: Secondary | ICD-10-CM

## 2020-03-25 DIAGNOSIS — U071 COVID-19: Secondary | ICD-10-CM

## 2020-03-25 DIAGNOSIS — O24410 Gestational diabetes mellitus in pregnancy, diet controlled: Secondary | ICD-10-CM

## 2020-03-25 DIAGNOSIS — O0990 Supervision of high risk pregnancy, unspecified, unspecified trimester: Secondary | ICD-10-CM

## 2020-03-25 NOTE — Telephone Encounter (Signed)
Called to Discuss with patient about Covid symptoms and the use of the monoclonal antibody infusion for those with mild to moderate Covid symptoms and at a high risk of hospitalization.     Pt appears to qualify for this infusion due to co-morbid conditions and/or a member of an at-risk group in accordance with the FDA Emergency Use Authorization.    She feels awful today - fogginess, nasal congestion, cough, SOB, headache. No fevers/chills.  Sx started 12/19.  Qualifies for infusion with social vulnerability index 4, pregnant [redacted] weeks.

## 2020-03-25 NOTE — Progress Notes (Signed)
I connected by phone with Jackie Rojas on 03/25/2020 at 12:10 PM to discuss the potential use of a new treatment for mild to moderate COVID-19 viral infection in non-hospitalized patients.  This patient is a 34 y.o. female that meets the FDA criteria for Emergency Use Authorization of COVID monoclonal antibody casirivimab/imdevimab, bamlanivimab/etesevimab, or sotrovimab.  Has a (+) direct SARS-CoV-2 viral test result  Has mild or moderate COVID-19   Is NOT hospitalized due to COVID-19  Is within 10 days of symptom onset  Has at least one of the high risk factor(s) for progression to severe COVID-19 and/or hospitalization as defined in EUA.  Specific high risk criteria : BMI > 25, Pregnancy, Diabetes and Other high risk medical condition per CDC:  SVI 4    I have spoken and communicated the following to the patient or parent/caregiver regarding COVID monoclonal antibody treatment:  1. FDA has authorized the emergency use for the treatment of mild to moderate COVID-19 in adults and pediatric patients with positive results of direct SARS-CoV-2 viral testing who are 24 years of age and older weighing at least 40 kg, and who are at high risk for progressing to severe COVID-19 and/or hospitalization.  2. The significant known and potential risks and benefits of COVID monoclonal antibody, and the extent to which such potential risks and benefits are unknown.  3. Information on available alternative treatments and the risks and benefits of those alternatives, including clinical trials.  4. Patients treated with COVID monoclonal antibody should continue to self-isolate and use infection control measures (e.g., wear mask, isolate, social distance, avoid sharing personal items, clean and disinfect "high touch" surfaces, and frequent handwashing) according to CDC guidelines.   5. The patient or parent/caregiver has the option to accept or refuse COVID monoclonal antibody treatment.  After  reviewing this information with the patient, the patient has agreed to receive one of the available covid 19 monoclonal antibodies and will be provided an appropriate fact sheet prior to infusion. Rexene Alberts, NP 03/25/2020 12:10 PM

## 2020-03-26 ENCOUNTER — Ambulatory Visit (INDEPENDENT_AMBULATORY_CARE_PROVIDER_SITE_OTHER): Payer: BC Managed Care – PPO | Admitting: *Deleted

## 2020-03-26 ENCOUNTER — Ambulatory Visit (HOSPITAL_COMMUNITY)
Admission: RE | Admit: 2020-03-26 | Discharge: 2020-03-26 | Disposition: A | Discharge status: Home / Self Care | Payer: BC Managed Care – PPO | Source: Ambulatory Visit | Attending: Pulmonary Disease | Admitting: Pulmonary Disease

## 2020-03-26 ENCOUNTER — Other Ambulatory Visit: Payer: Self-pay

## 2020-03-26 DIAGNOSIS — O0990 Supervision of high risk pregnancy, unspecified, unspecified trimester: Secondary | ICD-10-CM

## 2020-03-26 DIAGNOSIS — O24410 Gestational diabetes mellitus in pregnancy, diet controlled: Secondary | ICD-10-CM

## 2020-03-26 DIAGNOSIS — O10919 Unspecified pre-existing hypertension complicating pregnancy, unspecified trimester: Secondary | ICD-10-CM

## 2020-03-26 DIAGNOSIS — E6609 Other obesity due to excess calories: Secondary | ICD-10-CM | POA: Diagnosis present

## 2020-03-26 DIAGNOSIS — U071 COVID-19: Secondary | ICD-10-CM | POA: Insufficient documentation

## 2020-03-26 MED ORDER — SODIUM CHLORIDE 0.9 % IV SOLN: INTRAVENOUS | Status: DC | PRN

## 2020-03-26 MED ORDER — DIPHENHYDRAMINE HCL 50 MG/ML IJ SOLN: 50.0000 mg | Freq: Once | INTRAMUSCULAR | Status: DC | PRN

## 2020-03-26 MED ORDER — METHYLPREDNISOLONE SODIUM SUCC 125 MG IJ SOLR: 125.0000 mg | Freq: Once | INTRAMUSCULAR | Status: DC | PRN

## 2020-03-26 MED ORDER — FAMOTIDINE IN NACL 20-0.9 MG/50ML-% IV SOLN: 20.0000 mg | Freq: Once | INTRAVENOUS | Status: DC | PRN

## 2020-03-26 MED ORDER — SODIUM CHLORIDE 0.9 % IV SOLN: Freq: Once | INTRAVENOUS | Status: AC

## 2020-03-26 MED ORDER — EPINEPHRINE 0.3 MG/0.3ML IJ SOAJ: 0.3000 mg | Freq: Once | INTRAMUSCULAR | Status: DC | PRN

## 2020-03-26 MED ORDER — ALBUTEROL SULFATE HFA 108 (90 BASE) MCG/ACT IN AERS: 2.0000 | INHALATION_SPRAY | Freq: Once | RESPIRATORY_TRACT | Status: DC | PRN

## 2020-03-26 NOTE — Discharge Instructions (Signed)
10 Things You Can Do to Manage Your COVID-19 Symptoms at Home If you have possible or confirmed COVID-19: 1. Stay home from work and school. And stay away from other public places. If you must go out, avoid using any kind of public transportation, ridesharing, or taxis. 2. Monitor your symptoms carefully. If your symptoms get worse, call your healthcare provider immediately. 3. Get rest and stay hydrated. 4. If you have a medical appointment, call the healthcare provider ahead of time and tell them that you have or may have COVID-19. 5. For medical emergencies, call 911 and notify the dispatch personnel that you have or may have COVID-19. 6. Cover your cough and sneezes with a tissue or use the inside of your elbow. 7. Wash your hands often with soap and water for at least 20 seconds or clean your hands with an alcohol-based hand sanitizer that contains at least 60% alcohol. 8. As much as possible, stay in a specific room and away from other people in your home. Also, you should use a separate bathroom, if available. If you need to be around other people in or outside of the home, wear a mask. 9. Avoid sharing personal items with other people in your household, like dishes, towels, and bedding. 10. Clean all surfaces that are touched often, like counters, tabletops, and doorknobs. Use household cleaning sprays or wipes according to the label instructions. cdc.gov/coronavirus 10/04/2018 This information is not intended to replace advice given to you by your health care provider. Make sure you discuss any questions you have with your health care provider. Document Revised: 03/08/2019 Document Reviewed: 03/08/2019 Elsevier Patient Education  2020 Elsevier Inc. What types of side effects do monoclonal antibody drugs cause?  Common side effects  In general, the more common side effects caused by monoclonal antibody drugs include: . Allergic reactions, such as hives or itching . Flu-like signs and  symptoms, including chills, fatigue, fever, and muscle aches and pains . Nausea, vomiting . Diarrhea . Skin rashes . Low blood pressure   The CDC is recommending patients who receive monoclonal antibody treatments wait at least 90 days before being vaccinated.  Currently, there are no data on the safety and efficacy of mRNA COVID-19 vaccines in persons who received monoclonal antibodies or convalescent plasma as part of COVID-19 treatment. Based on the estimated half-life of such therapies as well as evidence suggesting that reinfection is uncommon in the 90 days after initial infection, vaccination should be deferred for at least 90 days, as a precautionary measure until additional information becomes available, to avoid interference of the antibody treatment with vaccine-induced immune responses. If you have any questions or concerns after the infusion please call the Advanced Practice Provider on call at [phone]. This number is ONLY intended for your use regarding questions or concerns about the infusion post-treatment side-effects.  Please do not provide this number to others for use. For return to work notes please contact your primary care provider.   If someone you know is interested in receiving treatment please have them call the COVID hotline at [phone].   

## 2020-03-26 NOTE — Progress Notes (Signed)
  Diagnosis: COVID-19  Physician: Dr. Shan Levans  Procedure: Covid Infusion Clinic Med: bamlanivimab\etesevimab infusion - Provided patient with bamlanimivab\etesevimab fact sheet for patients, parents and caregivers prior to infusion.  Complications: No immediate complications noted.  Discharge: Discharged home   Jackie Rojas 03/26/2020

## 2020-03-26 NOTE — Progress Notes (Addendum)
Patient reviewed Fact Sheet for Patients, Parents, and Caregivers for Emergency Use Authorization (EUA) of bamlanivimab and etesevimab for the Treatment of Coronavirus. Patient also reviewed and is agreeable to the estimated cost of treatment. Patient is agreeable to proceed.   

## 2020-03-26 NOTE — Progress Notes (Addendum)
   NURSE VISIT- NST  SUBJECTIVE:  Jackie Rojas is a 34 y.o. [id number] female at [redacted]w[redacted]d, here for a NST for pregnancy complicated by [place] and A2DM currently on NPH.  She reports active fetal movement, contractions: none, vaginal bleeding: none, membranes: intact.   OBJECTIVE:  LMP 07/17/2019 (Approximate)   Appears well, no apparent distress  No results found for this or any previous visit (from the past 24 hour(s)).  NST: FHR baseline 135 bpm, Variability: moderate, Accelerations:present, Decelerations:  Absent= Cat 1/Reactive Toco: none   ASSESSMENT: [id number] at [redacted]w[redacted]d with CHTN and A2DM currently on NPH NST reactive  PLAN: EFM strip reviewed by [name], CNM, [facility]   Recommendations: keep next appointment as scheduled    Jobe Marker  03/26/2020 4:38 PM   Chart reviewed for nurse visit. Agree with plan of care.  Cheral Marker, [place] 03/31/2020 9:04 AM

## 2020-03-31 ENCOUNTER — Telehealth (INDEPENDENT_AMBULATORY_CARE_PROVIDER_SITE_OTHER): Payer: BC Managed Care – PPO | Admitting: Obstetrics & Gynecology

## 2020-03-31 ENCOUNTER — Other Ambulatory Visit: Payer: Self-pay

## 2020-03-31 ENCOUNTER — Encounter: Payer: Self-pay | Admitting: Obstetrics & Gynecology

## 2020-03-31 ENCOUNTER — Other Ambulatory Visit: Payer: BC Managed Care – PPO

## 2020-03-31 DIAGNOSIS — O24419 Gestational diabetes mellitus in pregnancy, unspecified control: Secondary | ICD-10-CM

## 2020-03-31 DIAGNOSIS — O0990 Supervision of high risk pregnancy, unspecified, unspecified trimester: Secondary | ICD-10-CM

## 2020-03-31 NOTE — Progress Notes (Signed)
HIGH-RISK PREGNANCY VISIT Patient name: Jackie Rojas MRN [id number]  Date of birth: 12/13/1985 Chief Complaint:   Routine Prenatal Visit (NST)  History of Present Illness:   Jackie Rojas is a 34 y.o. [phone] female at [redacted]w[redacted]d with an Estimated Date of Delivery: 05/08/20 being seen today for ongoing management of a high-risk pregnancy complicated by A2DM.  Today she reports no complaints.  Depression screen [facility] 2/9 03/17/2020 12/18/2019 10/26/2019  Decreased Interest 0 3 0  Down, Depressed, Hopeless 0 3 0  PHQ - 2 Score 0 6 0  Altered sleeping - 3 0  Tired, decreased energy - 2 0  Change in appetite - 3 1  Feeling bad or failure about yourself  - 3 0  Trouble concentrating - 3 0  Moving slowly or fidgety/restless - 1 0  Suicidal thoughts - 1 0  PHQ-9 Score - 22 1  Difficult doing work/chores - Very difficult -     .  .   . denies leaking of fluid.  Review of Systems:   Pertinent items are noted in HPI Denies abnormal vaginal discharge w/ itching/odor/irritation, headaches, visual changes, shortness of breath, chest pain, abdominal pain, severe nausea/vomiting, or problems with urination or bowel movements unless otherwise stated above. Pertinent History Reviewed:  Reviewed past medical,surgical, social, obstetrical and family history.  Reviewed problem list, medications and allergies. Physical Assessment:  There were no vitals filed for this visit.There is no height or weight on file to calculate BMI.           Physical Examination:   General appearance: alert, well appearing, and in no distress  Mental status: alert, oriented to person, place, and time  Skin: warm & dry   Extremities:      Cardiovascular: normal heart rate noted  Respiratory: normal respiratory effort, no distress  Abdomen: gravid, soft, non-tender  Pelvic: Cervical exam deferred         Fetal Status:          Fetal Surveillance Testing today: Reactive NST   Jackie Rojas is at [redacted]w[redacted]d Estimated Date of  Delivery: 05/08/20  NST being performed due to A2DM  Today the NST is Reactive  Fetal Monitoring:  135 bpm, good variability no decelerations reative NST  The accelerations are >15 bpm and more than 2 in 20 minutes  Final diagnosis:  Reactive NST  Lazaro Arms, MD     Chaperone: N/A    No results found for this or any previous visit (from the past 24 hour(s)).  Assessment & Plan:  High-risk pregnancy: [id number] at [redacted]w[redacted]d with an Estimated Date of Delivery: 05/08/20   1) A2 DM, increase hs NPG to 26 units  2) +COVID 1 week ago, S/P MCA, doing well feels better  Meds: No orders of the defined types were placed in this encounter.   Labs/procedures today: NST  Treatment Plan:  NST 3 days in office viit 1 week  Reviewed: Preterm labor symptoms and general obstetric precautions including but not limited to vaginal bleeding, contractions, leaking of fluid and fetal movement were reviewed in detail with the patient.  All questions were answered. Has home bp cuff. Rx faxed to . Check bp weekly, let us know if >140/90.   Follow-up: No follow-ups on file.   Future Appointments  Date Time Provider Department Center  04/03/2020  3:50 PM [facility] NURSE [facility]  04/07/2020 11:00 AM [facility] [place] [facility] None  04/07/2020 11:50 AM Korrin Waterfield, Amaryllis Dyke, MD [facility] [place] [place]  04/10/2020 10:50 AM [facility]  04/14/2020 10:30 AM [facility] [place] [facility] None  04/14/2020 11:30 AM Lazaro Arms, MD [facility]  04/17/2020 10:10 AM [facility] NURSE [facility]  04/21/2020 10:00 AM [facility] [place] [facility] None  04/21/2020 10:50 AM Venora Maples, MD [facility]  04/24/2020 10:10 AM [facility] NURSE [facility]  04/28/2020 11:00 AM [facility] [place] [facility] None  04/28/2020 11:50 AM Cheral Marker, CNM [facility]  05/01/2020 10:30 AM [facility]  05/05/2020  9:00 AM [facility] [place] [facility] None  05/05/2020  9:50 AM Lazaro Arms, MD [facility]  05/08/2020 10:10 AM [facility] NURSE [facility]  05/12/2020  9:00 AM [facility] [place] [facility] None  05/12/2020  9:50 AM Cresenzo-Dishmon, Scarlette Calico, CNM [facility]  05/15/2020 10:10 AM [facility] N[facility]    No orders of the defined types were placed in this encounter.  Lazaro Arms  03/31/2020 4:47 PM

## 2020-04-03 ENCOUNTER — Ambulatory Visit (INDEPENDENT_AMBULATORY_CARE_PROVIDER_SITE_OTHER): Payer: BC Managed Care – PPO | Admitting: *Deleted

## 2020-04-03 ENCOUNTER — Other Ambulatory Visit: Payer: Self-pay

## 2020-04-03 ENCOUNTER — Other Ambulatory Visit: Payer: Self-pay | Admitting: Obstetrics & Gynecology

## 2020-04-03 DIAGNOSIS — O10919 Unspecified pre-existing hypertension complicating pregnancy, unspecified trimester: Secondary | ICD-10-CM

## 2020-04-03 DIAGNOSIS — O24419 Gestational diabetes mellitus in pregnancy, unspecified control: Secondary | ICD-10-CM

## 2020-04-03 NOTE — Progress Notes (Addendum)
   NURSE VISIT- NST  SUBJECTIVE:  Jackie Rojas is a 34 y.o. [phone] female at [redacted]w[redacted]d, here for a NST for pregnancy complicated by [employer].  She reports active fetal movement, contractions: none, vaginal bleeding: none, membranes: intact.   OBJECTIVE:  LMP 07/17/2019 (Approximate)   Appears well, no apparent distress  No results found for this or any previous visit (from the past 24 hour(s)).  NST: FHR baseline 130 bpm, Variability: moderate, Accelerations:present, Decelerations:  Absent= Cat 1/Reactive Toco: none   ASSESSMENT: [id number] at [redacted]w[redacted]d with CHTN NST reactive  PLAN: EFM strip reviewed by Cathie Beams, CNM   Recommendations: keep next appointment as scheduled    Annamarie Dawley  04/03/2020 5:06 PM   Chart reviewed for nurse visit. Agree with plan of care.  Jacklyn Shell, CNM 04/03/2020 10:00 PM

## 2020-04-05 DIAGNOSIS — Z419 Encounter for procedure for purposes other than remedying health state, unspecified: Secondary | ICD-10-CM | POA: Diagnosis not present

## 2020-04-05 NOTE — L&D Delivery Note (Addendum)
OB/GYN Faculty Practice Delivery Note  Jackie Rojas is a 35 y.o. [id number] s/p IOL at [redacted]w[redacted]d. She was admitted for [facility] with superimposed preeclampsia with severe features.   ROM: 0940 with clear fluid GBS Status:  Positive/-- (01/10 1510) Maximum Maternal Temperature: afebrile  Labor Progress: . Initial SVE: 3/80. She then progressed to complete.   Delivery Date/Time: 04/23/20 @ 1015 Delivery: Called to room and patient was complete. Head delivered without difficulty. No nuchal cord present. Shoulder and body delivered in usual fashion. Infant with spontaneous cry, placed on mother's abdomen, dried and stimulated. Cord clamped x 2 after 1-minute delay, and cut by father of the baby. Cord blood drawn. Placenta delivered spontaneously with gentle cord traction. Fundus firm with massage and Pitocin. Labia, perineum, vagina, and cervix inspected inspected with no lacerations.  Baby Weight: pending  Placenta: Sent to L&D Complications: None Lacerations: none EBL: 100 mL Analgesia: Epidural   Infant:  APGAR (1 MIN): 8   APGAR (5 MINS): 9   APGAR (10 MINS):     [name], DO [facility] for [facility] 04/23/2020, 10:45 AM

## 2020-04-06 ENCOUNTER — Encounter (HOSPITAL_COMMUNITY): Payer: Self-pay | Admitting: Obstetrics & Gynecology

## 2020-04-06 ENCOUNTER — Inpatient Hospital Stay (HOSPITAL_COMMUNITY)
Admission: AD | Admit: 2020-04-06 | Discharge: 2020-04-06 | Disposition: A | Discharge status: Home / Self Care | Payer: BC Managed Care – PPO | Attending: Obstetrics & Gynecology | Admitting: Obstetrics & Gynecology

## 2020-04-06 ENCOUNTER — Other Ambulatory Visit: Payer: Self-pay

## 2020-04-06 DIAGNOSIS — O10013 Pre-existing essential hypertension complicating pregnancy, third trimester: Secondary | ICD-10-CM | POA: Insufficient documentation

## 2020-04-06 DIAGNOSIS — O10913 Unspecified pre-existing hypertension complicating pregnancy, third trimester: Secondary | ICD-10-CM | POA: Diagnosis not present

## 2020-04-06 DIAGNOSIS — O24414 Gestational diabetes mellitus in pregnancy, insulin controlled: Secondary | ICD-10-CM | POA: Diagnosis not present

## 2020-04-06 DIAGNOSIS — G44201 Tension-type headache, unspecified, intractable: Secondary | ICD-10-CM | POA: Insufficient documentation

## 2020-04-06 DIAGNOSIS — O10919 Unspecified pre-existing hypertension complicating pregnancy, unspecified trimester: Secondary | ICD-10-CM

## 2020-04-06 DIAGNOSIS — O98513 Other viral diseases complicating pregnancy, third trimester: Secondary | ICD-10-CM | POA: Diagnosis not present

## 2020-04-06 DIAGNOSIS — Z3A35 35 weeks gestation of pregnancy: Secondary | ICD-10-CM | POA: Diagnosis not present

## 2020-04-06 DIAGNOSIS — G44209 Tension-type headache, unspecified, not intractable: Secondary | ICD-10-CM

## 2020-04-06 DIAGNOSIS — O99353 Diseases of the nervous system complicating pregnancy, third trimester: Secondary | ICD-10-CM | POA: Diagnosis not present

## 2020-04-06 DIAGNOSIS — R519 Headache, unspecified: Secondary | ICD-10-CM | POA: Diagnosis present

## 2020-04-06 DIAGNOSIS — U071 COVID-19: Secondary | ICD-10-CM | POA: Diagnosis not present

## 2020-04-06 LAB — CBC
HCT: 32 % — ABNORMAL LOW (ref 36.0–46.0)
Hemoglobin: 10 g/dL — ABNORMAL LOW (ref 12.0–15.0)
MCH: 24.3 pg — ABNORMAL LOW (ref 26.0–34.0)
MCHC: 31.3 g/dL (ref 30.0–36.0)
MCV: 77.9 fL — ABNORMAL LOW (ref 80.0–100.0)
Platelets: 179 10*3/uL (ref 150–400)
RBC: 4.11 MIL/uL (ref 3.87–5.11)
RDW: 18.1 % — ABNORMAL HIGH (ref 11.5–15.5)
WBC: 4.7 10*3/uL (ref 4.0–10.5)
nRBC: 0 % (ref 0.0–0.2)

## 2020-04-06 LAB — URINALYSIS, ROUTINE W REFLEX MICROSCOPIC
Bilirubin Urine: NEGATIVE
Glucose, UA: NEGATIVE mg/dL
Hgb urine dipstick: NEGATIVE
Ketones, ur: NEGATIVE mg/dL
Nitrite: NEGATIVE
Protein, ur: NEGATIVE mg/dL
Specific Gravity, Urine: 1.012 (ref 1.005–1.030)
pH: 6 (ref 5.0–8.0)

## 2020-04-06 LAB — PROTEIN / CREATININE RATIO, URINE
Creatinine, Urine: 115.96 mg/dL
Protein Creatinine Ratio: 0.11 mg/mg{Cre} (ref 0.00–0.15)
Total Protein, Urine: 13 mg/dL

## 2020-04-06 LAB — COMPREHENSIVE METABOLIC PANEL
ALT: 12 U/L (ref 0–44)
AST: 15 U/L (ref 15–41)
Albumin: 2.4 g/dL — ABNORMAL LOW (ref 3.5–5.0)
Alkaline Phosphatase: 113 U/L (ref 38–126)
Anion gap: 8 (ref 5–15)
BUN: 13 mg/dL (ref 6–20)
CO2: 22 mmol/L (ref 22–32)
Calcium: 8.7 mg/dL — ABNORMAL LOW (ref 8.9–10.3)
Chloride: 105 mmol/L (ref 98–111)
Creatinine, Ser: 0.86 mg/dL (ref 0.44–1.00)
GFR, Estimated: >60 mL/min (ref >60)
Glucose, Bld: 104 mg/dL — ABNORMAL HIGH (ref 70–99)
Potassium: 3.7 mmol/L (ref 3.5–5.1)
Sodium: 135 mmol/L (ref 135–145)
Total Bilirubin: 0.9 mg/dL (ref 0.3–1.2)
Total Protein: 5.9 g/dL — ABNORMAL LOW (ref 6.5–8.1)

## 2020-04-06 LAB — FERRITIN: Ferritin: 8 ng/mL — ABNORMAL LOW (ref 11–307)

## 2020-04-06 MED ORDER — LABETALOL HCL 200 MG PO TABS: 400.0000 mg | ORAL_TABLET | Freq: Three times a day (TID) | ORAL | 0 refills | Status: DC

## 2020-04-06 MED ORDER — METOCLOPRAMIDE HCL 5 MG/ML IJ SOLN
10.0000 mg | Freq: Once | INTRAMUSCULAR | Status: AC
  Administered 2020-04-06: 10 mg via INTRAVENOUS
  Filled 2020-04-06: qty 2

## 2020-04-06 MED ORDER — LABETALOL HCL 5 MG/ML IV SOLN
40.0000 mg | INTRAVENOUS | Status: DC | PRN
  Filled 2020-04-06: qty 8

## 2020-04-06 MED ORDER — DIPHENHYDRAMINE HCL 50 MG/ML IJ SOLN
25.0000 mg | Freq: Once | INTRAMUSCULAR | Status: AC
  Administered 2020-04-06: 25 mg via INTRAVENOUS
  Filled 2020-04-06: qty 1

## 2020-04-06 MED ORDER — HYDRALAZINE HCL 20 MG/ML IJ SOLN: 10.0000 mg | INTRAMUSCULAR | Status: DC | PRN

## 2020-04-06 MED ORDER — SODIUM CHLORIDE 0.9 % IV SOLN: INTRAVENOUS | Status: DC

## 2020-04-06 MED ORDER — LABETALOL HCL 5 MG/ML IV SOLN: 80.0000 mg | INTRAVENOUS | Status: DC | PRN

## 2020-04-06 MED ORDER — LABETALOL HCL 100 MG PO TABS
200.0000 mg | ORAL_TABLET | Freq: Once | ORAL | Status: AC
  Administered 2020-04-06: 200 mg via ORAL
  Filled 2020-04-06: qty 2

## 2020-04-06 MED ORDER — LABETALOL HCL 5 MG/ML IV SOLN
20.0000 mg | INTRAVENOUS | Status: DC | PRN
  Administered 2020-04-06: 20 mg via INTRAVENOUS
  Filled 2020-04-06: qty 4

## 2020-04-06 MED ORDER — ACETAMINOPHEN 500 MG PO TABS
1000.0000 mg | ORAL_TABLET | Freq: Once | ORAL | Status: DC
  Filled 2020-04-06: qty 2

## 2020-04-06 MED ORDER — DEXAMETHASONE SODIUM PHOSPHATE 10 MG/ML IJ SOLN
10.0000 mg | Freq: Once | INTRAMUSCULAR | Status: AC
  Administered 2020-04-06: 10 mg via INTRAVENOUS
  Filled 2020-04-06: qty 1

## 2020-04-06 NOTE — MAU Note (Signed)
..  Jackie Rojas is a 35 y.o. at [redacted]w[redacted]d here in MAU reporting: dizziness, nausea, and vomiting since Friday. Patient states she took her blood pressure at home and it was in the 170's-160's. Reports a headache 6/10, took tylenol around 1 am and has not had relief.   Pain score: 6/10 Vitals:   04/06/20 0246  BP: (!) 136/91  Pulse: 94  Resp: 17  Temp: 98.7 F (37.1 C)  SpO2: 100%     FHT: 150

## 2020-04-06 NOTE — Discharge Instructions (Signed)

## 2020-04-06 NOTE — MAU Provider Note (Signed)
Chief Complaint:  Headache   Event Date/Time   First Provider Initiated Contact with Patient 04/06/20 0329     HPI: Jackie Rojas is a 35 y.o. S2G3151 at 52w3dwho presents to maternity admissions reporting severe headache all day with associated dizziness and blurred vision if she keeps her eyes open while dizzy. She denies epigastric and flank pain or floaters in her vision. She is prescribed labetalol 8026mTID but was only taking 20038mID with "good blood pressure until today). She has not taken her labetalol at all today as she spent the day in bed with her headache and dizziness. She reports nausea when her BP spiked but none otherwise. She last took 1000m58m Tylenol at midnight. She endorses mild BH contractions and some increased pressure over the past two days, but nothing consistent or overly uncomfortable. Denies vaginal bleeding, leaking of fluid, decreased fetal movement, fever, or falls. She was Covid+ on 03/24/20.   Pregnancy Course: A2GDM (insulin), cHTN on labetalol, Covid infection  Past Medical History:  Diagnosis Date  . Gallstones   . Gestational hypertension    PP  . HSV-2 infection    OB History  Gravida Para Term Preterm AB Living  5 3 3   1 3   SAB IAB Ectopic Multiple Live Births  1       2    # Outcome Date GA Lbr Len/2nd Weight Sex Delivery Anes PTL Lv  5 Current           4 Term 12/21/16 39w059w0db 4 oz (2.835 kg) F Vag-Spont EPI N   3 Term 09/26/14 49w0d60w0d 6 oz (2.892 kg) F Vag-Spont EPI N LIV  2 SAB 2012 [redacted]w[redacted]d  [redacted]w[redacted]d  1 Term 09/25/08 [redacted]w[redacted]d  [redacted]w[redacted]d oz (2.92 kg) F Vag-Spont EPI N LIV   Past Surgical History:  Procedure Laterality Date  . CHOLECYSTECTOMY  02/2019  . TONSILLECTOMY     Family History  Problem Relation Age of Onset  . Diabetes Maternal Aunt   . Hypertension Maternal Aunt   . Hypertension Maternal Uncle   . Diabetes Paternal Aunt   . Hypertension Paternal Uncle   . Diabetes Paternal Uncle   . Gout Maternal Grandmother   .  Liver disease Maternal Grandmother   . Cancer Paternal Grandmother        bone   Social History   Tobacco Use  . Smoking status: Never Smoker  . Smokeless tobacco: Never Used  Vaping Use  . Vaping Use: Never used  Substance Use Topics  . Alcohol use: Not Currently  . Drug use: Never   No Known Allergies No medications prior to admission.    I have reviewed patient's Past Medical Hx, Surgical Hx, Family Hx, Social Hx, medications and allergies.   ROS:  Review of Systems  Constitutional: Positive for fatigue. Negative for fever.  HENT: Negative for congestion and sore throat.   Eyes: Positive for photophobia and visual disturbance.  Respiratory: Negative for chest tightness and shortness of breath.   Cardiovascular: Negative for chest pain.  Gastrointestinal: Positive for nausea and vomiting (one episode of emesis after dinner when her BP spiked). Negative for abdominal pain.  Genitourinary: Negative for flank pain.  Neurological: Positive for dizziness and headaches. Negative for syncope and light-headedness.    Physical Exam   No data found. Constitutional: Well-developed, well-nourished female in mild distress.  Cardiovascular: normal rate & rhythm, no murmur Respiratory:  normal effort, lung sounds clear throughout GI: Abd soft, non-tender, gravid appropriate for gestational age. Pos BS x 4 MS: Extremities nontender, no edema, normal ROM Neurologic: Alert and oriented x 4.  GU: no CVA tenderness Pelvic exam deferred  Fetal Tracing: reactive Baseline: 150 (increased to 180 for 53mn then returned to 150 with good variability) Variability: moderate to marked Accelerations: present Decelerations: none Toco: relaxed to UI   Labs: No results found for this or any previous visit (from the past 24 hour(s)).  Imaging:  No results found.  MAU Course: Orders Placed This Encounter  Procedures  . Culture, OB Urine  . Urinalysis, Routine w reflex microscopic Urine,  Clean Catch  . Comprehensive metabolic panel  . CBC  . Protein / creatinine ratio, urine  . Ferritin  . Discharge patient   Meds ordered this encounter  Medications  . DISCONTD: acetaminophen (TYLENOL) tablet 1,000 mg  . DISCONTD: labetalol (NORMODYNE) injection 20 mg  . DISCONTD: labetalol (NORMODYNE) injection 40 mg  . DISCONTD: labetalol (NORMODYNE) injection 80 mg  . DISCONTD: hydrALAZINE (APRESOLINE) injection 10 mg  . DISCONTD: 0.9 %  sodium chloride infusion  . AND Linked Order Group   . diphenhydrAMINE (BENADRYL) injection 25 mg   . metoCLOPramide (REGLAN) injection 10 mg   . dexamethasone (DECADRON) injection 10 mg  . labetalol (NORMODYNE) tablet 200 mg  . labetalol (NORMODYNE) 200 MG tablet    Sig: Take 2 tablets (400 mg total) by mouth 3 (three) times daily.    Dispense:  90 tablet    Refill:  0    Order Specific Question:   Supervising Provider    Answer:   PMerrily Pew   MDM: Headache cocktail given which brought headache from 8-10/10 to 1-2/10 2073mlabetalol ordered, she had two additional severe range pressures Labetalol protocol ordered, got to 4089mV and BP stabilized Preeclampsia labs ordered (plus bile acids and ferritin per pink sticky reminder)  Discussed with pt why she had not been taking labetalol as prescribed, she stated the 800m72mse made her vomit so she was told to decrease to 200mg19mit kept her BP below severe range.   Consulted Dr. ArnolRoselie Awkwardhe presentation and treatment, he recommended pt restart on 400mg 10mtalol TID and have a BP check in office on Monday. Pt already has an appointment with Dr. Eure sElonda Huskyuled for Monday.  Assessment: 1. Acute non intractable tension-type headache   2. Chronic hypertension affecting pregnancy    Plan: Discharge home in stable condition with return precautions.     Follow-up Information    [facility].   Specialty: Obstetrics and Gynecology Why: for BP  check Contact information: [phone]        Allergies as of 04/06/2020   No Known Allergies     Medication List    TAKE these medications   Accu-Chek Guide Me w/Device Kit 1 each by Does not apply route 4 (four) times daily.   Accu-Chek Guide test strip Generic drug: glucose blood Use as instructed to check blood sugar 4 times daily   Accu-Chek Softclix Lancets lancets Use as instructed to check blood sugar 4 times daily   acetaminophen 500 MG tablet Commonly known as: TYLENOL Take 500 mg by mouth every 6 (six) hours as needed.   Blood Pressure Cuff Misc 1 Device by Does not apply route once  a week.   calcium carbonate 750 MG chewable tablet Commonly known as: TUMS EX Chew 1 tablet by mouth daily.   famotidine 20 MG tablet Commonly known as: Pepcid Take 1 tablet (20 mg total) by mouth 2 (two) times daily.   ferrous sulfate 325 (65 FE) MG tablet Take 1 tablet (325 mg total) by mouth every other day.   insulin NPH Human 100 UNIT/ML injection Commonly known as: NOVOLIN N Inject 0.2 mLs (20 Units total) into the skin at bedtime. Please supply needles and syringes   labetalol 200 MG tablet Commonly known as: NORMODYNE Take 2 tablets (400 mg total) by mouth 3 (three) times daily. What changed: how much to take   PRENATAL VITAMINS PO Take 1 tablet by mouth daily.   sertraline 25 MG tablet Commonly known as: ZOLOFT Take 1 tablet (25 mg total) by mouth daily.   valACYclovir 500 MG tablet Commonly known as: VALTREX Take 1 tablet (500 mg total) by mouth 2 (two) times daily.      Gaylan Gerold, MSN, CNM, [facility]

## 2020-04-07 ENCOUNTER — Encounter: Payer: Self-pay | Admitting: Obstetrics & Gynecology

## 2020-04-07 ENCOUNTER — Ambulatory Visit (INDEPENDENT_AMBULATORY_CARE_PROVIDER_SITE_OTHER): Payer: BC Managed Care – PPO

## 2020-04-07 ENCOUNTER — Ambulatory Visit (INDEPENDENT_AMBULATORY_CARE_PROVIDER_SITE_OTHER): Payer: Medicaid Other | Admitting: Obstetrics & Gynecology

## 2020-04-07 VITALS — BP 142/88 | HR 82 | Wt 317.0 lb

## 2020-04-07 DIAGNOSIS — O24419 Gestational diabetes mellitus in pregnancy, unspecified control: Secondary | ICD-10-CM

## 2020-04-07 DIAGNOSIS — Z331 Pregnant state, incidental: Secondary | ICD-10-CM

## 2020-04-07 DIAGNOSIS — O10919 Unspecified pre-existing hypertension complicating pregnancy, unspecified trimester: Secondary | ICD-10-CM

## 2020-04-07 DIAGNOSIS — Z3A35 35 weeks gestation of pregnancy: Secondary | ICD-10-CM

## 2020-04-07 DIAGNOSIS — Z1389 Encounter for screening for other disorder: Secondary | ICD-10-CM

## 2020-04-07 DIAGNOSIS — O0990 Supervision of high risk pregnancy, unspecified, unspecified trimester: Secondary | ICD-10-CM

## 2020-04-07 LAB — POCT URINALYSIS DIPSTICK OB
Blood, UA: NEGATIVE
Glucose, UA: NEGATIVE
Ketones, UA: NEGATIVE
Leukocytes, UA: NEGATIVE
Nitrite, UA: NEGATIVE

## 2020-04-07 LAB — BILE ACIDS, TOTAL: Bile Acids Total: 6 umol/L (ref 0.0–10.0)

## 2020-04-07 LAB — CULTURE, OB URINE: Culture: NO GROWTH

## 2020-04-07 MED ORDER — BUTALBITAL-APAP-CAFFEINE 50-325-40 MG PO TABS: 1.0000 | ORAL_TABLET | Freq: Four times a day (QID) | ORAL | 0 refills | Status: DC | PRN

## 2020-04-07 NOTE — Progress Notes (Signed)
[place] 35+4 wks,cephalic,BPP 8/8,FHR 146 BPM,anterior placenta gr 3,RI .52,.54,.63,.58=71%,AFI 15.5 cm

## 2020-04-07 NOTE — Progress Notes (Signed)
HIGH-RISK PREGNANCY VISIT Patient name: Jackie Rojas MRN [id number]  Date of birth: 1985/12/07 Chief Complaint:   Routine Prenatal Visit  History of Present Illness:   Jackie Rojas is a 35 y.o. [id number] female at [redacted]w[redacted]d with an Estimated Date of Delivery: 05/08/20 being seen today for ongoing management of a high-risk pregnancy complicated by chronic hypertension currently on labetalol 400 TID and diabetes mellitus A2DM currently on NPH 20 units at hs.  Today she reports headache.  Depression screen [facility] 2/9 03/17/2020 12/18/2019 10/26/2019  Decreased Interest 0 3 0  Down, Depressed, Hopeless 0 3 0  PHQ - 2 Score 0 6 0  Altered sleeping - 3 0  Tired, decreased energy - 2 0  Change in appetite - 3 1  Feeling bad or failure about yourself  - 3 0  Trouble concentrating - 3 0  Moving slowly or fidgety/restless - 1 0  Suicidal thoughts - 1 0  PHQ-9 Score - 22 1  Difficult doing work/chores - Very difficult -    Contractions: Irritability. Vag. Bleeding: None.  Movement: Present. denies leaking of fluid.  Review of Systems:   Pertinent items are noted in HPI Denies abnormal vaginal discharge w/ itching/odor/irritation, headaches, visual changes, shortness of breath, chest pain, abdominal pain, severe nausea/vomiting, or problems with urination or bowel movements unless otherwise stated above. Pertinent History Reviewed:  Reviewed past medical,surgical, social, obstetrical and family history.  Reviewed problem list, medications and allergies. Physical Assessment:   Vitals:   04/07/20 1142  BP: (!) 142/88  Pulse: 82  Weight: (!) 317 lb (143.8 kg)  Body mass index is 51.17 kg/m.           Physical Examination:   General appearance: alert, well appearing, and in no distress  Mental status: alert, oriented to person, place, and time  Skin: warm & dry   Extremities: Edema: Trace    Cardiovascular: normal heart rate noted  Respiratory: normal respiratory effort, no distress  Abdomen:  gravid, soft, non-tender  Pelvic: Cervical exam deferred         Fetal Status:     Movement: Present    Fetal Surveillance Testing today: BPP 8/8 normal Dopplers   Chaperone: N/A    Results for orders placed or performed in visit on 04/07/20 (from the past 24 hour(s))  POC Urinalysis Dipstick OB   Collection Time: 04/07/20 11:34 AM  Result Value Ref Range   Color, UA     Clarity, UA     Glucose, UA Negative Negative   Bilirubin, UA     Ketones, UA neg    Spec Grav, UA     Blood, UA neg    pH, UA     POC,PROTEIN,UA Trace Negative, Trace, Small (1+), Moderate (2+), Large (3+), 4+   Urobilinogen, UA     Nitrite, UA neg    Leukocytes, UA Negative Negative   Appearance     Odor      Assessment & Plan:  High-risk pregnancy: [id number] at [redacted]w[redacted]d with an Estimated Date of Delivery: 05/08/20   1) CHTN, stable, on labetalol 400 TID stable BP, headache but pressure is ok and has chronic headaches previous to pregnncy  2) A2DM, stable, on NPH 20 units at hs  Meds:  Meds ordered this encounter  Medications  . butalbital-acetaminophen-caffeine (FIORICET) 50-325-40 MG tablet    Sig: Take 1-2 tablets by mouth every 6 (six) hours as needed for headache.    Dispense:  20 tablet  Refill:  0    Labs/procedures today:   Treatment Plan:  Twice weekly testing, IOL 39 weeks or as clinically indicated  Reviewed: Preterm labor symptoms and general obstetric precautions including but not limited to vaginal bleeding, contractions, leaking of fluid and fetal movement were reviewed in detail with the patient.  All questions were answered. Has home bp cuff. Rx faxed to . Check bp weekly, let us know if >140/90.   Follow-up: Return in about 3 days (around 04/10/2020) for Nurse only, NST.   Future Appointments  Date Time Provider Department Center  04/10/2020 10:50 AM [facility] NURSE [facility]  04/14/2020 10:30 AM [facility] [place] [facility] None  04/14/2020 11:30 AM Lazaro Arms, MD [facility]  04/17/2020 10:10 AM [facility]  04/21/2020 10:00 AM [facility] [place] [facility] None  04/21/2020 10:50 AM Venora Maples, MD [facility]  04/24/2020 10:10 AM [facility] NURSE [facility]  04/28/2020 11:00 AM [facility] [place] [facility] None  04/28/2020 11:50 AM Cheral Marker, CNM [facility]  05/01/2020 10:30 AM [facility]  05/05/2020  9:00 AM [facility] [place] [facility] None  05/05/2020  9:50 AM Lazaro Arms, MD [facility]  05/08/2020 10:10 AM [facility]  05/12/2020  9:00 AM [facility] [place] [facility] None  05/12/2020  9:50 AM Jacklyn Shell, CNM [facility]  05/15/2020 10:10 AM [facility]    Orders Placed This Encounter  Procedures  . POC Urinalysis Dipstick OB   Amaryllis Dyke Hazim Treadway  04/07/2020 12:01 PM

## 2020-04-10 ENCOUNTER — Other Ambulatory Visit: Payer: BC Managed Care – PPO

## 2020-04-11 ENCOUNTER — Other Ambulatory Visit: Payer: Self-pay | Admitting: Obstetrics & Gynecology

## 2020-04-11 DIAGNOSIS — O24419 Gestational diabetes mellitus in pregnancy, unspecified control: Secondary | ICD-10-CM

## 2020-04-11 DIAGNOSIS — O10919 Unspecified pre-existing hypertension complicating pregnancy, unspecified trimester: Secondary | ICD-10-CM

## 2020-04-14 ENCOUNTER — Encounter: Payer: Self-pay | Admitting: Obstetrics & Gynecology

## 2020-04-14 ENCOUNTER — Other Ambulatory Visit: Payer: Self-pay

## 2020-04-14 ENCOUNTER — Other Ambulatory Visit (HOSPITAL_COMMUNITY)
Admission: RE | Admit: 2020-04-14 | Discharge: 2020-04-14 | Disposition: A | Discharge status: Home / Self Care | Payer: BC Managed Care – PPO | Source: Ambulatory Visit | Attending: Obstetrics & Gynecology | Admitting: Obstetrics & Gynecology

## 2020-04-14 ENCOUNTER — Ambulatory Visit (INDEPENDENT_AMBULATORY_CARE_PROVIDER_SITE_OTHER): Payer: Medicaid Other | Admitting: Obstetrics & Gynecology

## 2020-04-14 ENCOUNTER — Ambulatory Visit (INDEPENDENT_AMBULATORY_CARE_PROVIDER_SITE_OTHER): Payer: BC Managed Care – PPO

## 2020-04-14 VITALS — BP 143/89 | HR 87 | Wt 318.0 lb

## 2020-04-14 DIAGNOSIS — Z3A36 36 weeks gestation of pregnancy: Secondary | ICD-10-CM | POA: Insufficient documentation

## 2020-04-14 DIAGNOSIS — O0990 Supervision of high risk pregnancy, unspecified, unspecified trimester: Secondary | ICD-10-CM

## 2020-04-14 DIAGNOSIS — O24419 Gestational diabetes mellitus in pregnancy, unspecified control: Secondary | ICD-10-CM | POA: Diagnosis not present

## 2020-04-14 DIAGNOSIS — O09893 Supervision of other high risk pregnancies, third trimester: Secondary | ICD-10-CM | POA: Diagnosis not present

## 2020-04-14 DIAGNOSIS — O10919 Unspecified pre-existing hypertension complicating pregnancy, unspecified trimester: Secondary | ICD-10-CM

## 2020-04-14 DIAGNOSIS — Z331 Pregnant state, incidental: Secondary | ICD-10-CM | POA: Insufficient documentation

## 2020-04-14 DIAGNOSIS — Z1389 Encounter for screening for other disorder: Secondary | ICD-10-CM

## 2020-04-14 LAB — POCT URINALYSIS DIPSTICK OB
Blood, UA: NEGATIVE
Glucose, UA: NEGATIVE
Ketones, UA: NEGATIVE
Leukocytes, UA: NEGATIVE
Nitrite, UA: NEGATIVE
POC,PROTEIN,UA: NEGATIVE

## 2020-04-14 NOTE — Progress Notes (Signed)
HIGH-RISK PREGNANCY VISIT Patient name: Jackie Rojas MRN [id number]  Date of birth: October 25, 1985 Chief Complaint:   Routine Prenatal Visit ([place] today)  History of Present Illness:   Jackie Rojas is a 35 y.o. [phone] female at [redacted]w[redacted]d with an Estimated Date of Delivery: 05/08/20 being seen today for ongoing management of a high-risk pregnancy complicated by chronic hypertension currently on labettalol 400 TID and diabetes mellitus A2DM currently on NPH 26 units at bedtime.  Today she reports no complaints.  Depression screen [facility] 2/9 03/17/2020 12/18/2019 10/26/2019  Decreased Interest 0 3 0  Down, Depressed, Hopeless 0 3 0  PHQ - 2 Score 0 6 0  Altered sleeping - 3 0  Tired, decreased energy - 2 0  Change in appetite - 3 1  Feeling bad or failure about yourself  - 3 0  Trouble concentrating - 3 0  Moving slowly or fidgety/restless - 1 0  Suicidal thoughts - 1 0  PHQ-9 Score - 22 1  Difficult doing work/chores - Very difficult -    Contractions: Irritability. Vag. Bleeding: None.  Movement: Present. denies leaking of fluid.  Review of Systems:   Pertinent items are noted in HPI Denies abnormal vaginal discharge w/ itching/odor/irritation, headaches, visual changes, shortness of breath, chest pain, abdominal pain, severe nausea/vomiting, or problems with urination or bowel movements unless otherwise stated above. Pertinent History Reviewed:  Reviewed past medical,surgical, social, obstetrical and family history.  Reviewed problem list, medications and allergies. Physical Assessment:   Vitals:   04/14/20 1127  BP: (!) 143/89  Pulse: 87  Weight: (!) 318 lb (144.2 kg)  Body mass index is 51.33 kg/m.           Physical Examination:   General appearance: alert, well appearing, and in no distress  Mental status: alert, oriented to person, place, and time  Skin: warm & dry   Extremities: Edema: Trace    Cardiovascular: normal heart rate noted  Respiratory: normal respiratory effort,  no distress  Abdomen: gravid, soft, non-tender  Pelvic:  Dilation: 2 Effacement (%): 50 Station: -3  Fetal Status: Fetal Heart Rate (bpm): 146   Movement: Present Presentation: Vertex  Fetal Surveillance Testing today: BPP 8/8 with normal Dopplers   Chaperone: N/A    Results for orders placed or performed in visit on 04/14/20 (from the past 24 hour(s))  POC Urinalysis Dipstick OB   Collection Time: 04/14/20 11:30 AM  Result Value Ref Range   Color, UA     Clarity, UA     Glucose, UA Negative Negative   Bilirubin, UA     Ketones, UA neg    Spec Grav, UA     Blood, UA neg    pH, UA     POC,PROTEIN,UA Negative Negative, Trace, Small (1+), Moderate (2+), Large (3+), 4+   Urobilinogen, UA     Nitrite, UA neg    Leukocytes, UA Negative Negative   Appearance     Odor      Assessment & Plan:  High-risk pregnancy: [id number] at [redacted]w[redacted]d with an Estimated Date of Delivery: 05/08/20   1) CHTN, stable, on labetalol 400 TID  2) Class A2DM, stable, 26 units NPH with good CBG  Meds: No orders of the defined types were placed in this encounter.   Labs/procedures today: BPP 8/8, Dopplers 40%  Treatment Plan:  Twice weekly testing IOL 39 wks unless otherwise indicated  Reviewed: Preterm labor symptoms and general obstetric precautions including but not limited to vaginal bleeding, contractions,  leaking of fluid and fetal movement were reviewed in detail with the patient.  All questions were answered. Has home bp cuff. Rx faxed to . Check bp weekly, let us know if >140/90.   Follow-up: Return in about 3 days (around 04/17/2020) for NST, Nurse only.   Future Appointments  Date Time Provider Department Center  04/17/2020 10:10 AM [facility] NURSE [facility]  04/21/2020 10:00 AM [facility] [place] [facility] None  04/21/2020 10:50 AM Venora Maples, MD [facility]  04/24/2020 10:10 AM [facility]  04/28/2020 11:00 AM [facility] [place] [facility] None  04/28/2020 11:50 AM  Cheral Marker, CNM [facility]  05/01/2020 10:30 AM [facility]  05/05/2020  9:00 AM [facility] [place] [facility] None  05/05/2020 10:00 AM Lazaro Arms, MD [facility]  05/08/2020 10:10 AM [facility] N[facility]SE [facility]  05/12/2020  9:00 AM [facility] [place] [facility] None  05/12/2020  9:50 AM Jacklyn Shell, CNM [facility]  05/15/2020 10:10 AM [facility] NURSE [facility]    Orders Placed This Encounter  Procedures  . Culture, beta strep (group b only)  . POC Urinalysis Dipstick OB   Jackie Rojas Jackie Rojas  04/14/2020 11:50 AM

## 2020-04-14 NOTE — Progress Notes (Signed)
[place] 36+4 wks,cephalic,anterior placenta gr 3,fhr 146 bpm,BPP 8/8,EFW 2982 g 55%,RI .51,.56,.57=40%

## 2020-04-15 LAB — CERVICOVAGINAL ANCILLARY ONLY
Chlamydia: NEGATIVE
Comment: NEGATIVE
Comment: NORMAL
Neisseria Gonorrhea: NEGATIVE

## 2020-04-16 ENCOUNTER — Telehealth: Payer: Self-pay

## 2020-04-16 NOTE — Telephone Encounter (Signed)
I attempted to reach Jackie Rojas today to schedule her a phone visit with the [facility] RN Case Manager. I left my name and number for her to call me back. I will reach out to her again in the next 7-14 days.

## 2020-04-17 ENCOUNTER — Ambulatory Visit (INDEPENDENT_AMBULATORY_CARE_PROVIDER_SITE_OTHER): Payer: BC Managed Care – PPO | Admitting: *Deleted

## 2020-04-17 ENCOUNTER — Other Ambulatory Visit: Payer: Self-pay

## 2020-04-17 VITALS — BP 125/79 | HR 92 | Wt 317.0 lb

## 2020-04-17 DIAGNOSIS — O24419 Gestational diabetes mellitus in pregnancy, unspecified control: Secondary | ICD-10-CM

## 2020-04-17 DIAGNOSIS — Z331 Pregnant state, incidental: Secondary | ICD-10-CM

## 2020-04-17 DIAGNOSIS — O0990 Supervision of high risk pregnancy, unspecified, unspecified trimester: Secondary | ICD-10-CM

## 2020-04-17 DIAGNOSIS — Z1389 Encounter for screening for other disorder: Secondary | ICD-10-CM | POA: Diagnosis not present

## 2020-04-17 DIAGNOSIS — O10919 Unspecified pre-existing hypertension complicating pregnancy, unspecified trimester: Secondary | ICD-10-CM

## 2020-04-17 DIAGNOSIS — O288 Other abnormal findings on antenatal screening of mother: Secondary | ICD-10-CM

## 2020-04-17 LAB — POCT URINALYSIS DIPSTICK OB
Blood, UA: NEGATIVE
Glucose, UA: NEGATIVE
Ketones, UA: NEGATIVE
Nitrite, UA: NEGATIVE

## 2020-04-17 LAB — CULTURE, BETA STREP (GROUP B ONLY): Strep Gp B Culture: POSITIVE — AB

## 2020-04-17 NOTE — Progress Notes (Addendum)
   NURSE VISIT- NST  SUBJECTIVE:  Jackie Rojas is a 35 y.o. [phone] female at [redacted]w[redacted]d, here for a NST for pregnancy complicated by [place] and A2/BDM currently on Insulin.  She reports active fetal movement, contractions: occasional, vaginal bleeding: none, membranes: intact.   OBJECTIVE:  BP 125/79   Pulse 92   Wt (!) 317 lb (143.8 kg)   LMP 07/17/2019 (Approximate)   BMI 51.17 kg/m   Appears well, no apparent distress  Results for orders placed or performed in visit on 04/17/20 (from the past 24 hour(s))  POC Urinalysis Dipstick OB   Collection Time: 04/17/20 10:30 AM  Result Value Ref Range   Color, UA     Clarity, UA     Glucose, UA Negative Negative   Bilirubin, UA     Ketones, UA neg    Spec Grav, UA     Blood, UA neg    pH, UA     POC,PROTEIN,UA Trace Negative, Trace, Small (1+), Moderate (2+), Large (3+), 4+   Urobilinogen, UA     Nitrite, UA neg    Leukocytes, UA Trace (A) Negative   Appearance     Odor      NST: FHR 160 bpm Toco: unable to trace   ASSESSMENT: [id number] at [redacted]w[redacted]d with CHTN and A2DM currently on Insulin Attempted to obtain fetal tracing for 25 minutes however, fetus was extremely active with visual and audible movement noted along with mother stating "baby is moving a lot". When traced, FHR 155-160.  PLAN: Discuss with Dr. Despina Hidden who came to bedside with patient.  Fetal movement noted as well.  Recommendations: keep next appointment as scheduled    Jobe Marker  04/17/2020 11:49 AM   Attestation of Attending Supervision of Nursing Visit Encounter: Evaluation and management procedures were performed by the nursing staff under my supervision and collaboration.  I have reviewed the nurse's note and chart, and I agree with the management and plan.  Rockne Coons MD Attending Physician for the [facility] 04/17/2020 4:55 PM

## 2020-04-18 ENCOUNTER — Other Ambulatory Visit: Payer: Self-pay | Admitting: Obstetrics & Gynecology

## 2020-04-18 DIAGNOSIS — O10919 Unspecified pre-existing hypertension complicating pregnancy, unspecified trimester: Secondary | ICD-10-CM

## 2020-04-18 DIAGNOSIS — O24419 Gestational diabetes mellitus in pregnancy, unspecified control: Secondary | ICD-10-CM

## 2020-04-21 ENCOUNTER — Other Ambulatory Visit: Payer: BC Managed Care – PPO

## 2020-04-21 ENCOUNTER — Inpatient Hospital Stay (EMERGENCY_DEPARTMENT_HOSPITAL)
Admission: AD | Admit: 2020-04-21 | Discharge: 2020-04-21 | Disposition: A | Discharge status: Home / Self Care | Payer: BC Managed Care – PPO | Source: Home / Self Care | Attending: Family Medicine | Admitting: Family Medicine

## 2020-04-21 ENCOUNTER — Other Ambulatory Visit: Payer: Self-pay

## 2020-04-21 ENCOUNTER — Encounter: Payer: BC Managed Care – PPO | Admitting: Family Medicine

## 2020-04-21 ENCOUNTER — Encounter (HOSPITAL_COMMUNITY): Payer: Self-pay | Admitting: Family Medicine

## 2020-04-21 DIAGNOSIS — O26893 Other specified pregnancy related conditions, third trimester: Secondary | ICD-10-CM

## 2020-04-21 DIAGNOSIS — O0993 Supervision of high risk pregnancy, unspecified, third trimester: Secondary | ICD-10-CM

## 2020-04-21 DIAGNOSIS — Z3A37 37 weeks gestation of pregnancy: Secondary | ICD-10-CM

## 2020-04-21 DIAGNOSIS — Z3689 Encounter for other specified antenatal screening: Secondary | ICD-10-CM | POA: Diagnosis not present

## 2020-04-21 DIAGNOSIS — O368130 Decreased fetal movements, third trimester, not applicable or unspecified: Secondary | ICD-10-CM | POA: Insufficient documentation

## 2020-04-21 DIAGNOSIS — R102 Pelvic and perineal pain: Secondary | ICD-10-CM | POA: Insufficient documentation

## 2020-04-21 DIAGNOSIS — O24419 Gestational diabetes mellitus in pregnancy, unspecified control: Secondary | ICD-10-CM | POA: Diagnosis not present

## 2020-04-21 DIAGNOSIS — O09293 Supervision of pregnancy with other poor reproductive or obstetric history, third trimester: Secondary | ICD-10-CM | POA: Insufficient documentation

## 2020-04-21 DIAGNOSIS — R030 Elevated blood-pressure reading, without diagnosis of hypertension: Secondary | ICD-10-CM | POA: Diagnosis not present

## 2020-04-21 DIAGNOSIS — O24414 Gestational diabetes mellitus in pregnancy, insulin controlled: Secondary | ICD-10-CM | POA: Insufficient documentation

## 2020-04-21 DIAGNOSIS — O114 Pre-existing hypertension with pre-eclampsia, complicating childbirth: Secondary | ICD-10-CM | POA: Diagnosis not present

## 2020-04-21 DIAGNOSIS — O0990 Supervision of high risk pregnancy, unspecified, unspecified trimester: Secondary | ICD-10-CM

## 2020-04-21 LAB — URINALYSIS, ROUTINE W REFLEX MICROSCOPIC
Bilirubin Urine: NEGATIVE
Glucose, UA: NEGATIVE mg/dL
Hgb urine dipstick: NEGATIVE
Ketones, ur: NEGATIVE mg/dL
Nitrite: NEGATIVE
Protein, ur: 30 mg/dL — AB
Specific Gravity, Urine: 1.02 (ref 1.005–1.030)
pH: 6 (ref 5.0–8.0)

## 2020-04-21 MED ORDER — ACETAMINOPHEN 500 MG PO TABS
1000.0000 mg | ORAL_TABLET | Freq: Four times a day (QID) | ORAL | Status: DC | PRN
  Administered 2020-04-21: 1000 mg via ORAL
  Filled 2020-04-21: qty 2

## 2020-04-21 NOTE — Discharge Instructions (Signed)
Fetal Movement Counts Patient Name: ________________________________________________ Patient Due Date: ____________________  What is a fetal movement count? A fetal movement count is the number of times that you feel your baby move during a certain amount of time. This may also be called a fetal kick count. A fetal movement count is recommended for every pregnant woman. You may be asked to start counting fetal movements as early as week 28 of your pregnancy. Pay attention to when your baby is most active. You may notice your baby's sleep and wake cycles. You may also notice things that make your baby move more. You should do a fetal movement count:  When your baby is normally most active.  At the same time each day. A good time to count movements is while you are resting, after having something to eat and drink. How do I count fetal movements? 1. Find a quiet, comfortable area. Sit, or lie down on your side. 2. Write down the date, the start time and stop time, and the number of movements that you felt between those two times. Take this information with you to your health care visits. 3. Write down your start time when you feel the first movement. 4. Count kicks, flutters, swishes, rolls, and jabs. You should feel at least 10 movements. 5. You may stop counting after you have felt 10 movements, or if you have been counting for 2 hours. Write down the stop time. 6. If you do not feel 10 movements in 2 hours, contact your health care provider for further instructions. Your health care provider may want to do additional tests to assess your baby's well-being. Contact a health care provider if:  You feel fewer than 10 movements in 2 hours.  Your baby is not moving like he or she usually does. Date: ____________ Start time: ____________ Stop time: ____________ Movements: ____________ Date: ____________ Start time: ____________ Stop time: ____________ Movements: ____________ Date: ____________  Start time: ____________ Stop time: ____________ Movements: ____________ Date: ____________ Start time: ____________ Stop time: ____________ Movements: ____________ Date: ____________ Start time: ____________ Stop time: ____________ Movements: ____________ Date: ____________ Start time: ____________ Stop time: ____________ Movements: ____________ Date: ____________ Start time: ____________ Stop time: ____________ Movements: ____________ Date: ____________ Start time: ____________ Stop time: ____________ Movements: ____________ Date: ____________ Start time: ____________ Stop time: ____________ Movements: ____________ This information is not intended to replace advice given to you by your health care provider. Make sure you discuss any questions you have with your health care provider. Document Revised: 11/09/2018 Document Reviewed: 11/09/2018 Elsevier Patient Education  2021 Elsevier Inc.  

## 2020-04-21 NOTE — MAU Note (Signed)
Pt reports  Increased pelvic pressure for the past 2 days. Stated ctx are occasional. Reports decreased fetal movement. Also c/o sharp pain in her back. Denies any vag bleeding or leaking.

## 2020-04-21 NOTE — MAU Provider Note (Signed)
History     CSN: [id number]  Arrival date and time: 04/21/20 1508   Event Date/Time   First Provider Initiated Contact with Patient 04/21/20 1609      Chief Complaint  Patient presents with  . Pelvic Pain   35 y.o. G5P3013 @37.4 wks presenting with pelvic pressure and decreased FM. Reports onset 2 days ago. She is feeling FM but is less frequent. Describes pain as constant pressure, rates 7/10. Has not tried anything for it. Reports occasional ctx. No VB or LOF.  OB History    Gravida  5   Para  3   Term  3   Preterm      AB  1   Living  3     SAB  1   IAB      Ectopic      Multiple      Live Births  2           Past Medical History:  Diagnosis Date  . Gallstones   . Gestational hypertension    PP  . HSV-2 infection     Past Surgical History:  Procedure Laterality Date  . CHOLECYSTECTOMY  02/2019  . TONSILLECTOMY      Family History  Problem Relation Age of Onset  . Diabetes Maternal Aunt   . Hypertension Maternal Aunt   . Hypertension Maternal Uncle   . Diabetes Paternal Aunt   . Hypertension Paternal Uncle   . Diabetes Paternal Uncle   . Gout Maternal Grandmother   . Liver disease Maternal Grandmother   . Cancer Paternal Grandmother        bone    Social History   Tobacco Use  . Smoking status: Never Smoker  . Smokeless tobacco: Never Used  Vaping Use  . Vaping Use: Never used  Substance Use Topics  . Alcohol use: Not Currently  . Drug use: Never    Allergies: No Known Allergies  Medications Prior to Admission  Medication Sig Dispense Refill Last Dose  . Accu-Chek Softclix Lancets lancets Use as instructed to check blood sugar 4 times daily 100 each 12 04/21/2020 at Unknown time  . Blood Glucose Monitoring Suppl (ACCU-CHEK GUIDE ME) w/Device KIT 1 each by Does not apply route 4 (four) times daily. 1 kit 0 04/21/2020 at Unknown time  . Blood Pressure Monitoring (BLOOD PRESSURE CUFF) MISC 1 Device by Does not apply route once a  week. 1 each 0 04/21/2020 at Unknown time  . calcium carbonate (TUMS EX) 750 MG chewable tablet Chew 1 tablet by mouth daily.   04/21/2020 at Unknown time  . ferrous sulfate 325 (65 FE) MG tablet Take 1 tablet (325 mg total) by mouth every other day. 45 tablet 0 Past Week at Unknown time  . glucose blood (ACCU-CHEK GUIDE) test strip Use as instructed to check blood sugar 4 times daily 50 each 12 04/21/2020 at Unknown time  . insulin NPH Human (NOVOLIN N) 100 UNIT/ML injection Inject 0.2 mLs (20 Units total) into the skin at bedtime. Please supply needles and syringes (Patient taking differently: Inject 126 Units into the skin at bedtime. Please supply needles and syringes) 10 mL 3 04/20/2020 at Unknown time  . labetalol (NORMODYNE) 200 MG tablet Take 2 tablets (400 mg total) by mouth 3 (three) times daily. 90 tablet 0 04/21/2020  . Prenatal Vit-Fe Fumarate-FA (PRENATAL VITAMINS PO) Take 1 tablet by mouth daily.   Past Week at Unknown time  . sertraline (ZOLOFT) 25 MG tablet   Take 1 tablet (25 mg total) by mouth daily. 30 tablet 11 04/20/2020 at Unknown time  . valACYclovir (VALTREX) 500 MG tablet Take 1 tablet (500 mg total) by mouth 2 (two) times daily. 60 tablet 11 04/20/2020 at Unknown time  . acetaminophen (TYLENOL) 500 MG tablet Take 500 mg by mouth every 6 (six) hours as needed. (Patient not taking: Reported on 04/17/2020)     . butalbital-acetaminophen-caffeine (FIORICET) 50-325-40 MG tablet Take 1-2 tablets by mouth every 6 (six) hours as needed for headache. (Patient not taking: Reported on 04/17/2020) 20 tablet 0   . famotidine (PEPCID) 20 MG tablet Take 1 tablet (20 mg total) by mouth 2 (two) times daily. 20 tablet 0     Review of Systems  Gastrointestinal: Negative for abdominal pain.  Genitourinary: Positive for pelvic pain. Negative for vaginal bleeding and vaginal discharge.   Physical Exam   Blood pressure 134/80, pulse 88, temperature 98 F (36.7 C), resp. rate 18, height 5' 6" (1.676 m),  weight (!) 144.7 kg, last menstrual period 07/17/2019.  Physical Exam Vitals and nursing note reviewed. Exam conducted with a chaperone present.  Constitutional:      General: She is not in acute distress.    Appearance: Normal appearance.  HENT:     Head: Normocephalic and atraumatic.  Cardiovascular:     Rate and Rhythm: Normal rate.  Pulmonary:     Effort: Pulmonary effort is normal. No respiratory distress.  Abdominal:     Palpations: Abdomen is soft.     Tenderness: There is no abdominal tenderness.  Genitourinary:    Comments: VE: 2/thick Musculoskeletal:        General: Normal range of motion.     Cervical back: Normal range of motion.  Skin:    General: Skin is warm and dry.  Neurological:     General: No focal deficit present.     Mental Status: She is alert and oriented to person, place, and time.  Psychiatric:        Mood and Affect: Mood normal.        Behavior: Behavior normal.   EFM: 130 bpm, mod variability, + accels, no decels Toco: irreg  Results for orders placed or performed during the hospital encounter of 04/21/20 (from the past 24 hour(s))  Urinalysis, Routine w reflex microscopic Urine, Clean Catch     Status: Abnormal   Collection Time: 04/21/20  4:59 PM  Result Value Ref Range   Color, Urine YELLOW YELLOW   APPearance HAZY (A) CLEAR   Specific Gravity, Urine 1.020 1.005 - 1.030   pH 6.0 5.0 - 8.0   Glucose, UA NEGATIVE NEGATIVE mg/dL   Hgb urine dipstick NEGATIVE NEGATIVE   Bilirubin Urine NEGATIVE NEGATIVE   Ketones, ur NEGATIVE NEGATIVE mg/dL   Protein, ur 30 (A) NEGATIVE mg/dL   Nitrite NEGATIVE NEGATIVE   Leukocytes,Ua LARGE (A) NEGATIVE   RBC / HPF 0-5 0 - 5 RBC/hpf   WBC, UA 6-10 0 - 5 WBC/hpf   Bacteria, UA RARE (A) NONE SEEN   Squamous Epithelial / LPF 0-5 0 - 5   Mucus PRESENT    MAU Course  Procedures  MDM Labs ordered and reviewed. NST reactive, pt marked >20 FMs in less than 1 hr. Pelvic pain is likely d/t fetal engagement  and physiologic to advanced pregnancy. Stable for discharge home.   Assessment and Plan   1. [redacted] weeks gestation of pregnancy   2. Gestational diabetes mellitus, class A2  3. Supervision of high risk pregnancy, antepartum   4. NST (non-stress test) reactive   5. Pelvic pain affecting pregnancy in third trimester, antepartum    Discharge home Follow up at [facility] tomorrow as scheduled Return precautions  Allergies as of 04/21/2020   No Known Allergies     Medication List    TAKE these medications   Accu-Chek Guide Me w/Device Kit 1 each by Does not apply route 4 (four) times daily.   Accu-Chek Guide test strip Generic drug: glucose blood Use as instructed to check blood sugar 4 times daily   Accu-Chek Softclix Lancets lancets Use as instructed to check blood sugar 4 times daily   acetaminophen 500 MG tablet Commonly known as: TYLENOL Take 500 mg by mouth every 6 (six) hours as needed.   Blood Pressure Cuff Misc 1 Device by Does not apply route once a week.   butalbital-acetaminophen-caffeine 50-325-40 MG tablet Commonly known as: FIORICET Take 1-2 tablets by mouth every 6 (six) hours as needed for headache.   calcium carbonate 750 MG chewable tablet Commonly known as: TUMS EX Chew 1 tablet by mouth daily.   famotidine 20 MG tablet Commonly known as: Pepcid Take 1 tablet (20 mg total) by mouth 2 (two) times daily.   ferrous sulfate 325 (65 FE) MG tablet Take 1 tablet (325 mg total) by mouth every other day.   insulin NPH Human 100 UNIT/ML injection Commonly known as: NOVOLIN N Inject 0.2 mLs (20 Units total) into the skin at bedtime. Please supply needles and syringes What changed: how much to take   labetalol 200 MG tablet Commonly known as: NORMODYNE Take 2 tablets (400 mg total) by mouth 3 (three) times daily.   PRENATAL VITAMINS PO Take 1 tablet by mouth daily.   sertraline 25 MG tablet Commonly known as: ZOLOFT Take 1 tablet (25 mg total) by mouth  daily.   valACYclovir 500 MG tablet Commonly known as: VALTREX Take 1 tablet (500 mg total) by mouth 2 (two) times daily.       , CNM 04/21/2020, 6:26 PM  

## 2020-04-22 ENCOUNTER — Ambulatory Visit (INDEPENDENT_AMBULATORY_CARE_PROVIDER_SITE_OTHER): Payer: BC Managed Care – PPO

## 2020-04-22 ENCOUNTER — Other Ambulatory Visit: Payer: Self-pay

## 2020-04-22 ENCOUNTER — Inpatient Hospital Stay (HOSPITAL_COMMUNITY)
Admission: AD | Admit: 2020-04-22 | Discharge: 2020-04-25 | DRG: 806 | Disposition: A | Discharge status: Home / Self Care | Payer: BC Managed Care – PPO | Attending: Obstetrics and Gynecology | Admitting: Obstetrics and Gynecology

## 2020-04-22 ENCOUNTER — Encounter (HOSPITAL_COMMUNITY): Payer: Self-pay | Admitting: Obstetrics and Gynecology

## 2020-04-22 ENCOUNTER — Ambulatory Visit (INDEPENDENT_AMBULATORY_CARE_PROVIDER_SITE_OTHER): Payer: Medicaid Other | Admitting: Family Medicine

## 2020-04-22 VITALS — BP 140/82 | HR 76 | Wt 316.4 lb

## 2020-04-22 DIAGNOSIS — O0993 Supervision of high risk pregnancy, unspecified, third trimester: Secondary | ICD-10-CM

## 2020-04-22 DIAGNOSIS — F329 Major depressive disorder, single episode, unspecified: Secondary | ICD-10-CM | POA: Diagnosis present

## 2020-04-22 DIAGNOSIS — A6000 Herpesviral infection of urogenital system, unspecified: Secondary | ICD-10-CM | POA: Diagnosis not present

## 2020-04-22 DIAGNOSIS — Z3A37 37 weeks gestation of pregnancy: Secondary | ICD-10-CM | POA: Diagnosis not present

## 2020-04-22 DIAGNOSIS — O99214 Obesity complicating childbirth: Secondary | ICD-10-CM | POA: Diagnosis present

## 2020-04-22 DIAGNOSIS — O1002 Pre-existing essential hypertension complicating childbirth: Secondary | ICD-10-CM | POA: Diagnosis present

## 2020-04-22 DIAGNOSIS — G43909 Migraine, unspecified, not intractable, without status migrainosus: Secondary | ICD-10-CM | POA: Diagnosis present

## 2020-04-22 DIAGNOSIS — Z8616 Personal history of COVID-19: Secondary | ICD-10-CM | POA: Diagnosis not present

## 2020-04-22 DIAGNOSIS — I10 Essential (primary) hypertension: Secondary | ICD-10-CM | POA: Diagnosis present

## 2020-04-22 DIAGNOSIS — O99344 Other mental disorders complicating childbirth: Secondary | ICD-10-CM | POA: Diagnosis present

## 2020-04-22 DIAGNOSIS — O24419 Gestational diabetes mellitus in pregnancy, unspecified control: Secondary | ICD-10-CM

## 2020-04-22 DIAGNOSIS — O24424 Gestational diabetes mellitus in childbirth, insulin controlled: Secondary | ICD-10-CM | POA: Diagnosis present

## 2020-04-22 DIAGNOSIS — O10919 Unspecified pre-existing hypertension complicating pregnancy, unspecified trimester: Secondary | ICD-10-CM | POA: Diagnosis not present

## 2020-04-22 DIAGNOSIS — Z1389 Encounter for screening for other disorder: Secondary | ICD-10-CM

## 2020-04-22 DIAGNOSIS — O0990 Supervision of high risk pregnancy, unspecified, unspecified trimester: Secondary | ICD-10-CM

## 2020-04-22 DIAGNOSIS — O114 Pre-existing hypertension with pre-eclampsia, complicating childbirth: Secondary | ICD-10-CM | POA: Diagnosis not present

## 2020-04-22 DIAGNOSIS — O9832 Other infections with a predominantly sexual mode of transmission complicating childbirth: Secondary | ICD-10-CM | POA: Diagnosis not present

## 2020-04-22 DIAGNOSIS — O99824 Streptococcus B carrier state complicating childbirth: Secondary | ICD-10-CM | POA: Diagnosis not present

## 2020-04-22 DIAGNOSIS — Z3689 Encounter for other specified antenatal screening: Secondary | ICD-10-CM | POA: Diagnosis not present

## 2020-04-22 DIAGNOSIS — O285 Abnormal chromosomal and genetic finding on antenatal screening of mother: Secondary | ICD-10-CM | POA: Diagnosis present

## 2020-04-22 DIAGNOSIS — Z8632 Personal history of gestational diabetes: Secondary | ICD-10-CM

## 2020-04-22 DIAGNOSIS — F418 Other specified anxiety disorders: Secondary | ICD-10-CM | POA: Diagnosis present

## 2020-04-22 DIAGNOSIS — O119 Pre-existing hypertension with pre-eclampsia, unspecified trimester: Secondary | ICD-10-CM | POA: Diagnosis present

## 2020-04-22 DIAGNOSIS — R102 Pelvic and perineal pain: Secondary | ICD-10-CM | POA: Diagnosis not present

## 2020-04-22 DIAGNOSIS — O26893 Other specified pregnancy related conditions, third trimester: Secondary | ICD-10-CM | POA: Diagnosis not present

## 2020-04-22 DIAGNOSIS — R030 Elevated blood-pressure reading, without diagnosis of hypertension: Secondary | ICD-10-CM | POA: Diagnosis not present

## 2020-04-22 DIAGNOSIS — O1092 Unspecified pre-existing hypertension complicating childbirth: Secondary | ICD-10-CM | POA: Diagnosis not present

## 2020-04-22 HISTORY — DX: Other complications of anesthesia, initial encounter: T88.59XA

## 2020-04-22 HISTORY — DX: Gestational diabetes mellitus in pregnancy, unspecified control: O24.419

## 2020-04-22 LAB — CBC
HCT: 32.1 % — ABNORMAL LOW (ref 36.0–46.0)
Hemoglobin: 9.7 g/dL — ABNORMAL LOW (ref 12.0–15.0)
MCH: 23.7 pg — ABNORMAL LOW (ref 26.0–34.0)
MCHC: 30.2 g/dL (ref 30.0–36.0)
MCV: 78.5 fL — ABNORMAL LOW (ref 80.0–100.0)
Platelets: 191 10*3/uL (ref 150–400)
RBC: 4.09 MIL/uL (ref 3.87–5.11)
RDW: 19 % — ABNORMAL HIGH (ref 11.5–15.5)
WBC: 7.7 10*3/uL (ref 4.0–10.5)
nRBC: 0 % (ref 0.0–0.2)

## 2020-04-22 LAB — POCT URINALYSIS DIPSTICK OB
Blood, UA: NEGATIVE
Glucose, UA: NEGATIVE
Ketones, UA: NEGATIVE
Leukocytes, UA: NEGATIVE
Nitrite, UA: NEGATIVE
POC,PROTEIN,UA: NEGATIVE

## 2020-04-22 LAB — TYPE AND SCREEN
ABO/RH(D): O POS
Antibody Screen: NEGATIVE

## 2020-04-22 LAB — COMPREHENSIVE METABOLIC PANEL
ALT: 11 U/L (ref 0–44)
AST: 15 U/L (ref 15–41)
Albumin: 2.4 g/dL — ABNORMAL LOW (ref 3.5–5.0)
Alkaline Phosphatase: 139 U/L — ABNORMAL HIGH (ref 38–126)
Anion gap: 10 (ref 5–15)
BUN: 7 mg/dL (ref 6–20)
CO2: 21 mmol/L — ABNORMAL LOW (ref 22–32)
Calcium: 8.9 mg/dL (ref 8.9–10.3)
Chloride: 105 mmol/L (ref 98–111)
Creatinine, Ser: 0.87 mg/dL (ref 0.44–1.00)
GFR, Estimated: >60 mL/min (ref >60)
Glucose, Bld: 102 mg/dL — ABNORMAL HIGH (ref 70–99)
Potassium: 3.6 mmol/L (ref 3.5–5.1)
Sodium: 136 mmol/L (ref 135–145)
Total Bilirubin: 0.5 mg/dL (ref 0.3–1.2)
Total Protein: 6.2 g/dL — ABNORMAL LOW (ref 6.5–8.1)

## 2020-04-22 LAB — GLUCOSE, CAPILLARY: Glucose-Capillary: 103 mg/dL — ABNORMAL HIGH (ref 70–99)

## 2020-04-22 MED ORDER — INSULIN ASPART 100 UNIT/ML ~~LOC~~ SOLN
0.0000 [IU] | SUBCUTANEOUS | Status: DC
  Administered 2020-04-22 – 2020-04-23 (×2): 1 [IU] via SUBCUTANEOUS

## 2020-04-22 MED ORDER — SERTRALINE HCL 25 MG PO TABS: 25.0000 mg | ORAL_TABLET | Freq: Every day | ORAL | Status: DC

## 2020-04-22 MED ORDER — OXYCODONE-ACETAMINOPHEN 5-325 MG PO TABS: 1.0000 | ORAL_TABLET | ORAL | Status: DC | PRN

## 2020-04-22 MED ORDER — PENICILLIN G POT IN DEXTROSE 60000 UNIT/ML IV SOLN
3.0000 10*6.[IU] | INTRAVENOUS | Status: DC
  Administered 2020-04-23 (×3): 3 10*6.[IU] via INTRAVENOUS
  Filled 2020-04-22 (×3): qty 50

## 2020-04-22 MED ORDER — BUTALBITAL-APAP-CAFFEINE 50-325-40 MG PO TABS
2.0000 | ORAL_TABLET | Freq: Four times a day (QID) | ORAL | Status: DC | PRN
  Administered 2020-04-22: 2 via ORAL
  Filled 2020-04-22: qty 2

## 2020-04-22 MED ORDER — LABETALOL HCL 200 MG PO TABS
400.0000 mg | ORAL_TABLET | Freq: Three times a day (TID) | ORAL | Status: DC
  Administered 2020-04-22 – 2020-04-23 (×2): 400 mg via ORAL
  Filled 2020-04-22: qty 4
  Filled 2020-04-22: qty 2

## 2020-04-22 MED ORDER — EPHEDRINE 5 MG/ML INJ: 10.0000 mg | INTRAVENOUS | Status: DC | PRN

## 2020-04-22 MED ORDER — INSULIN NPH (HUMAN) (ISOPHANE) 100 UNIT/ML ~~LOC~~ SUSP
13.0000 [IU] | Freq: Every day | SUBCUTANEOUS | Status: DC
  Administered 2020-04-22: 13 [IU] via SUBCUTANEOUS
  Filled 2020-04-22: qty 10

## 2020-04-22 MED ORDER — OXYTOCIN BOLUS FROM INFUSION
333.0000 mL | Freq: Once | INTRAVENOUS | Status: AC
  Administered 2020-04-23: 333 mL via INTRAVENOUS

## 2020-04-22 MED ORDER — ONDANSETRON HCL 4 MG/2ML IJ SOLN
4.0000 mg | Freq: Four times a day (QID) | INTRAMUSCULAR | Status: DC | PRN
  Administered 2020-04-23: 4 mg via INTRAVENOUS
  Filled 2020-04-22: qty 2

## 2020-04-22 MED ORDER — SODIUM CHLORIDE 0.9 % IV SOLN
5.0000 10*6.[IU] | Freq: Once | INTRAVENOUS | Status: AC
  Administered 2020-04-22: 5 10*6.[IU] via INTRAVENOUS
  Filled 2020-04-22: qty 5

## 2020-04-22 MED ORDER — LACTATED RINGERS IV SOLN: INTRAVENOUS | Status: DC

## 2020-04-22 MED ORDER — LACTATED RINGERS IV SOLN: 500.0000 mL | INTRAVENOUS | Status: DC | PRN

## 2020-04-22 MED ORDER — PHENYLEPHRINE 40 MCG/ML (10ML) SYRINGE FOR IV PUSH (FOR BLOOD PRESSURE SUPPORT): 80.0000 ug | PREFILLED_SYRINGE | INTRAVENOUS | Status: DC | PRN

## 2020-04-22 MED ORDER — DIPHENHYDRAMINE HCL 50 MG/ML IJ SOLN: 12.5000 mg | INTRAMUSCULAR | Status: DC | PRN

## 2020-04-22 MED ORDER — LABETALOL HCL 5 MG/ML IV SOLN: 20.0000 mg | INTRAVENOUS | Status: DC | PRN

## 2020-04-22 MED ORDER — HYDRALAZINE HCL 20 MG/ML IJ SOLN: 10.0000 mg | INTRAMUSCULAR | Status: DC | PRN

## 2020-04-22 MED ORDER — MISOPROSTOL 50MCG HALF TABLET
ORAL_TABLET | ORAL | Status: AC
  Administered 2020-04-23: 50 ug via BUCCAL
  Filled 2020-04-22: qty 1

## 2020-04-22 MED ORDER — MISOPROSTOL 50MCG HALF TABLET
50.0000 ug | ORAL_TABLET | Freq: Once | ORAL | Status: AC
  Administered 2020-04-22: 50 ug via BUCCAL

## 2020-04-22 MED ORDER — SERTRALINE HCL 50 MG PO TABS
25.0000 mg | ORAL_TABLET | Freq: Every day | ORAL | Status: DC
  Administered 2020-04-22: 25 mg via ORAL
  Filled 2020-04-22 (×2): qty 1

## 2020-04-22 MED ORDER — OXYCODONE-ACETAMINOPHEN 5-325 MG PO TABS: 2.0000 | ORAL_TABLET | ORAL | Status: DC | PRN

## 2020-04-22 MED ORDER — MAGNESIUM SULFATE 40 GM/1000ML IV SOLN
2.0000 g/h | INTRAVENOUS | Status: DC
  Administered 2020-04-22: 2 g/h via INTRAVENOUS
  Filled 2020-04-22: qty 1000

## 2020-04-22 MED ORDER — LABETALOL HCL 5 MG/ML IV SOLN: 80.0000 mg | INTRAVENOUS | Status: DC | PRN

## 2020-04-22 MED ORDER — LABETALOL HCL 5 MG/ML IV SOLN: 40.0000 mg | INTRAVENOUS | Status: DC | PRN

## 2020-04-22 MED ORDER — FENTANYL-BUPIVACAINE-NACL 0.5-0.125-0.9 MG/250ML-% EP SOLN
12.0000 mL/h | EPIDURAL | Status: DC | PRN
  Filled 2020-04-22: qty 250

## 2020-04-22 MED ORDER — TERBUTALINE SULFATE 1 MG/ML IJ SOLN
0.2500 mg | Freq: Once | INTRAMUSCULAR | Status: DC | PRN
Start: 2020-04-22 — End: 2020-04-23

## 2020-04-22 MED ORDER — ACETAMINOPHEN 325 MG PO TABS
650.0000 mg | ORAL_TABLET | ORAL | Status: DC | PRN
  Administered 2020-04-22: 650 mg via ORAL
  Filled 2020-04-22: qty 2

## 2020-04-22 MED ORDER — LIDOCAINE HCL (PF) 1 % IJ SOLN: 30.0000 mL | INTRAMUSCULAR | Status: DC | PRN

## 2020-04-22 MED ORDER — LACTATED RINGERS IV SOLN
500.0000 mL | Freq: Once | INTRAVENOUS | Status: AC
  Administered 2020-04-23: 500 mL via INTRAVENOUS

## 2020-04-22 MED ORDER — MAGNESIUM SULFATE BOLUS VIA INFUSION
6.0000 g | Freq: Once | INTRAVENOUS | Status: AC
  Administered 2020-04-22: 6 g via INTRAVENOUS
  Filled 2020-04-22: qty 1000

## 2020-04-22 MED ORDER — SOD CITRATE-CITRIC ACID 500-334 MG/5ML PO SOLN
30.0000 mL | ORAL | Status: DC | PRN
  Administered 2020-04-22: 30 mL via ORAL
  Filled 2020-04-22 (×2): qty 15

## 2020-04-22 MED ORDER — OXYTOCIN-SODIUM CHLORIDE 30-0.9 UT/500ML-% IV SOLN
2.5000 [IU]/h | INTRAVENOUS | Status: DC
  Filled 2020-04-22: qty 500

## 2020-04-22 NOTE — MAU Note (Signed)
Pt presents to MAU on referral from [facility] for elevated BP with headache and pelvic pressure.  Pt denies feeling ctx, no LOF or vaginal discharge per pt.  Pt endorses +FM.

## 2020-04-22 NOTE — Progress Notes (Signed)
Tyleigh Mahn is a 35 y.o. [id number] at [redacted]w[redacted]d by LMP & [place] admitted for induction of labor due to SIPE on cHTN.  Subjective: Patient resting comfortably  Objective: BP (!) 150/105   Pulse 96   Temp 98.7 F (37.1 C)   Resp 18   Ht 5\' 6"  (1.676 m)   Wt (!) 144.2 kg   LMP 07/17/2019 (Approximate)   SpO2 100%   BMI 51.33 kg/m  No intake/output data recorded. No intake/output data recorded.  FHT:  FHR: 135 bpm, variability: moderate,  accelerations:  Present,  decelerations:  Absent UC:   none SVE:     3/80/ballotable C Adalia Pettis, MD  Labs: Lab Results  Component Value Date   WBC 7.7 04/22/2020   HGB 9.7 (L) 04/22/2020   HCT 32.1 (L) 04/22/2020   MCV 78.5 (L) 04/22/2020   PLT 191 04/22/2020    Assessment / Plan: 35 yo G5P3013 at [redacted]w[redacted]d here for IOL d/t SIPE on cHTN w/ severe fx.  Induction: CE with dilation to 3cm, effacement at 20%. Plan to start cytotec x1 via buccal now. No need for FB. Will start pitocin as able. SIPE on cHTN: BP MR now (150s/110s), HA improved with tylenol. Admission PreE labs pending. Continue labetalol 400mg  TID for BP, on Mg. Fetal Wellbeing:  Category I Pain Control:  Epidural and IV pain meds I/D:  GBS pos, starting PCN now Anticipated MOD:  NSVD  GDMA2: Patient on 1/2 dose of NPH at 13U, CBG checks q4hours until active labor, then q2h. 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 MDD: Stable on zoloft. SW consult pp.  [redacted]w[redacted]d 04/22/2020, 9:44 PM

## 2020-04-22 NOTE — H&P (Signed)
Jackie Rojas is a 35 y.o. female [phone] with IUP at 68w5dby [place] LMP presenting for evaluation due to elevated BP with headache.  She reports positive fetal movement. She denies leakage of fluid or vaginal bleeding.  [facility]  Pregnancy complications: GDMA2 on insulin (26 units at bedtime), obesity, CHTN on labetalol 400 TID, has been taking Valtrex BID for herpes outbreak prevention.   - Past Medical History: Past Medical History:  Diagnosis Date  . Gallstones   . Gestational diabetes   . Gestational hypertension    PP  . HSV-2 infection     Past Surgical History: Past Surgical History:  Procedure Laterality Date  . CHOLECYSTECTOMY  02/2019  . TONSILLECTOMY      Obstetrical History: OB History    Gravida  5   Para  3   Term  3   Preterm      AB  1   Living  3     SAB  1   IAB      Ectopic      Multiple      Live Births  2            Social History: Social History   Socioeconomic History  . Marital status: Single    Spouse name: Not on file  . Number of children: Not on file  . Years of education: Not on file  . Highest education level: Not on file  Occupational History  . Not on file  Tobacco Use  . Smoking status: Never Smoker  . Smokeless tobacco: Never Used  Vaping Use  . Vaping Use: Never used  Substance and Sexual Activity  . Alcohol use: Not Currently  . Drug use: Never  . Sexual activity: Yes    Birth control/protection: None  Other Topics Concern  . Not on file  Social History Narrative  . Not on file   Social Determinants of Health   Financial Resource Strain: Low Risk   . Difficulty of Paying Living Expenses: Not very hard  Food Insecurity: No Food Insecurity  . Worried About RCharity fundraiserin the Last Year: Never true  . Ran Out of Food in the Last Year: Never true  Transportation Needs: No Transportation Needs  . Lack of Transportation (Medical): No  . Lack  of Transportation (Non-Medical): No  Physical Activity: Insufficiently Active  . Days of Exercise per Week: 1 day  . Minutes of Exercise per Session: 10 min  Stress: Stress Concern Present  . Feeling of Stress : Very much  Social Connections: Moderately Integrated  . Frequency of Communication with Friends and Family: More than three times a week  . Frequency of Social Gatherings with Friends and Family: Once a week  . Attends Religious Services: More than 4 times per year  . Active Member of Clubs or Organizations: Yes  . Attends CArchivistMeetings: More than 4 times per year  . Marital Status: Never married    Family History: Family History  Problem Relation Age of Onset  . Diabetes Maternal Aunt   . Hypertension Maternal Aunt   . Hypertension Maternal Uncle   . Diabetes Paternal Aunt   . Hypertension Paternal Uncle   . Diabetes Paternal Uncle   . Gout Maternal Grandmother   . Liver disease Maternal Grandmother   . Cancer Paternal Grandmother        bone    Allergies: No Known Allergies  Medications  Prior to Admission  Medication Sig Dispense Refill Last Dose  . butalbital-acetaminophen-caffeine (FIORICET) 50-325-40 MG tablet Take 1-2 tablets by mouth every 6 (six) hours as needed for headache. 20 tablet 0 04/22/2020 at Unknown time  . ferrous sulfate 325 (65 FE) MG tablet Take 1 tablet (325 mg total) by mouth every other day. 45 tablet 0 04/22/2020 at Unknown time  . labetalol (NORMODYNE) 200 MG tablet Take 2 tablets (400 mg total) by mouth 3 (three) times daily. 90 tablet 0 04/22/2020 at Unknown time  . pseudoephedrine (SUDAFED) 60 MG tablet Take 60 mg by mouth every 4 (four) hours as needed for congestion.   04/22/2020 at Unknown time  . valACYclovir (VALTREX) 500 MG tablet Take 1 tablet (500 mg total) by mouth 2 (two) times daily. 60 tablet 11 04/22/2020 at Unknown time  . Accu-Chek Softclix Lancets lancets Use as instructed to check blood sugar 4 times daily 100  each 12   . acetaminophen (TYLENOL) 500 MG tablet Take 500 mg by mouth every 6 (six) hours as needed.     . Blood Glucose Monitoring Suppl (ACCU-CHEK GUIDE ME) w/Device KIT 1 each by Does not apply route 4 (four) times daily. 1 kit 0   . Blood Pressure Monitoring (BLOOD PRESSURE CUFF) MISC 1 Device by Does not apply route once a week. 1 each 0   . calcium carbonate (TUMS EX) 750 MG chewable tablet Chew 1 tablet by mouth daily.     Marland Kitchen glucose blood (ACCU-CHEK GUIDE) test strip Use as instructed to check blood sugar 4 times daily 50 each 12   . insulin NPH Human (NOVOLIN N) 100 UNIT/ML injection Inject 0.2 mLs (20 Units total) into the skin at bedtime. Please supply needles and syringes (Patient taking differently: Inject 126 Units into the skin at bedtime. Please supply needles and syringes) 10 mL 3   . Prenatal Vit-Fe Fumarate-FA (PRENATAL VITAMINS PO) Take 1 tablet by mouth daily.     . sertraline (ZOLOFT) 25 MG tablet Take 1 tablet (25 mg total) by mouth daily. 30 tablet 11     Review of Systems   Constitutional: Negative for fever and chills Eyes: Negative for visual disturbances Respiratory: Negative for shortness of breath, dyspnea Cardiovascular: Negative for chest pain or palpitations  Gastrointestinal: Negative for vomiting, diarrhea and constipation.  POSITIVE for abdominal pain (contractions) Genitourinary: Negative for dysuria and urgency Musculoskeletal: Negative for back pain, joint pain, myalgias  Neurological: Negative for dizziness and headaches  Blood pressure (!) 152/90, pulse 94, temperature 98.7 F (37.1 C), resp. rate 18, height 5' 6"  (1.676 m), weight (!) 144.2 kg, last menstrual period 07/17/2019, SpO2 99 %. General appearance: alert, cooperative and no distress Lungs: normal respiratory effort Heart: regular rate and rhythm Abdomen: soft, non-tender; bowel sounds normal Extremities: Homans sign is negative, no sign of DVT DTR's 2+ Presentation: cephalic by [place]  Today in the office.  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 Normal fluid and norma  Prenatal Transfer Tool  Maternal Diabetes: Yes:  Diabetes Type:  Insulin/Medication controlled Genetic Screening: Normal Maternal Ultrasounds/Referrals: Other:Followed for obesity, GDMA2 and CHTN.  Fetal Ultrasounds or other Referrals:  Referred to Materal Fetal Medicine , Other: Followed by [facility] MDs and had all [place] at [facility] Maternal Substance Abuse:  No Significant Maternal Medications:  Meds include: Other: labetalol 400 TID and 26 units NPH, ferrous sulfate, fioricet, Valtrex.  Significant Maternal Lab Results: Group B Strep positive  Results for orders placed or performed in visit on 04/22/20 (from the past 24 hour(s))  POC Urinalysis Dipstick OB   Collection Time: 04/22/20  4:19 PM  Result Value Ref Range   Color, UA     Clarity, UA     Glucose, UA Negative Negative   Bilirubin, UA     Ketones, UA neg    Spec Grav, UA     Blood, UA neg    pH, UA     POC,PROTEIN,UA Negative Negative, Trace, Small (1+), Moderate (2+), Large (3+), 4+   Urobilinogen, UA     Nitrite, UA neg    Leukocytes, UA Negative Negative   Appearance     Odor      Assessment: Jackie Rojas is a 35 y.o. [id number] with an IUP at 90w5dnow with superimposed pre-eclampsia on chronic hypertensive. Additional  Plan: #Labor: expectant management #Pain:  Per request, planning epidural #FWB Cat 1 #ID: GBS: positive #MOF:   Wants to try breast #MOC: BTL; if she cannot get BTL she will schedule an interval tubal.  #Circ: Girl  -will start MgSo4 due to headache now -will draw CMP and PCR as well as CBC -will offer Fioriect for headache -will continue labetalol while inpatient at 400 mg TID  -Discussed with Dr. EDione Plover will order 13 units NPH at bedtime now, check BS q 4 hours.   KMervyn SkeetersKooistra 04/22/2020, 7:42 PM

## 2020-04-22 NOTE — Progress Notes (Signed)
[place] 37+5 wks,cephalic,fhr 133 bpm,anterior placenta gr 3,afi 11.5 cm,RI .58,.54,.57,.55=49%,BPP 8/8,limited view

## 2020-04-22 NOTE — Progress Notes (Signed)
HIGH-RISK PREGNANCY VISIT Patient name: Jackie Rojas MRN [id number]  Date of birth: 24-Dec-1985 Chief Complaint:   Routine Prenatal Visit and High Risk Gestation  History of Present Illness:   Roby Spalla is a 35 y.o. [id number] female at [redacted]w[redacted]d  with an Estimated Date of Delivery: 05/08/20 being seen today for ongoing management of a high-risk pregnancy complicated by chronic hypertension currently on labettalol 400 TID and diabetes mellitus A2DM currently on NPH 26 units at bedtime.  Today she reports headache-- started yesterday, tried tylenol, fioricet, sudafed with minimal relief. Took BP meds today. Has tried drinking more water.   Depression screen [facility] 2/9 03/17/2020 12/18/2019 10/26/2019  Decreased Interest 0 3 0  Down, Depressed, Hopeless 0 3 0  PHQ - 2 Score 0 6 0  Altered sleeping - 3 0  Tired, decreased energy - 2 0  Change in appetite - 3 1  Feeling bad or failure about yourself  - 3 0  Trouble concentrating - 3 0  Moving slowly or fidgety/restless - 1 0  Suicidal thoughts - 1 0  PHQ-9 Score - 22 1  Difficult doing work/chores - Very difficult -    Contractions: Irregular. Vag. Bleeding: None.  Movement: Present. denies leaking of fluid.  Review of Systems:   Pertinent items are noted in HPI Denies abnormal vaginal discharge w/ itching/odor/irritation, headaches, visual changes, shortness of breath, chest pain, abdominal pain, severe nausea/vomiting, or problems with urination or bowel movements unless otherwise stated above. Pertinent History Reviewed:  Reviewed past medical,surgical, social, obstetrical and family history.  Reviewed problem list, medications and allergies. Physical Assessment:   Vitals:   04/22/20 1633  BP: 140/82  Pulse: 76  Weight: (!) 316 lb 6.4 oz (143.5 kg)  Body mass index is 51.07 kg/m.           Physical Examination:   General appearance: alert, well appearing, and in no distress  Mental status: alert, oriented to person, place, and  time  Skin: warm & dry   Extremities: Edema: Trace    Cardiovascular: normal heart rate noted  Respiratory: normal respiratory effort, no distress  Abdomen: gravid, soft, non-tender  Pelvic:         Fetal Status:     Movement: Present    Fetal Surveillance Testing today: BPP 8/8 with normal Dopplers   Chaperone: N/A    Results for orders placed or performed in visit on 04/22/20 (from the past 24 hour(s))  POC Urinalysis Dipstick OB   Collection Time: 04/22/20  4:19 PM  Result Value Ref Range   Color, UA     Clarity, UA     Glucose, UA Negative Negative   Bilirubin, UA     Ketones, UA neg    Spec Grav, UA     Blood, UA neg    pH, UA     POC,PROTEIN,UA Negative Negative, Trace, Small (1+), Moderate (2+), Large (3+), 4+   Urobilinogen, UA     Nitrite, UA neg    Leukocytes, UA Negative Negative   Appearance     Odor    Results for orders placed or performed during the hospital encounter of 04/21/20 (from the past 24 hour(s))  Urinalysis, Routine w reflex microscopic Urine, Clean Catch   Collection Time: 04/21/20  4:59 PM  Result Value Ref Range   Color, Urine YELLOW YELLOW   APPearance HAZY (A) CLEAR   Specific Gravity, Urine 1.020 1.005 - 1.030   pH 6.0 5.0 - 8.0   Glucose,  UA NEGATIVE NEGATIVE mg/dL   Hgb urine dipstick NEGATIVE NEGATIVE   Bilirubin Urine NEGATIVE NEGATIVE   Ketones, ur NEGATIVE NEGATIVE mg/dL   Protein, ur 30 (A) NEGATIVE mg/dL   Nitrite NEGATIVE NEGATIVE   Leukocytes,Ua LARGE (A) NEGATIVE   RBC / HPF 0-5 0 - 5 RBC/hpf   WBC, UA 6-10 0 - 5 WBC/hpf   Bacteria, UA RARE (A) NONE SEEN   Squamous Epithelial / LPF 0-5 0 - 5   Mucus PRESENT     Assessment & Plan:  High-risk pregnancy: Q6P6195 at [redacted]w[redacted]d with an Estimated Date of Delivery: 05/08/20   1) CHTN- BP is within range that is typical.  reports constant HA since yesterday after MAU visit. Took Tylenol, fioricet, sudafed and has been drinking water. Nothing has helped. Sent to MAU for further work  up, labs and did mention to patient that she might be induced.   2) Class A2DM, stable, 26 units NPH with good CBG [place] 88-98-- typical  2 hr pp not checking  Meds: No orders of the defined types were placed in this encounter.   Labs/procedures today: BPP 8/8, Dopplers 49%  Treatment Plan:  Twice weekly testing IOL 39 wks unless otherwise indicated  Reviewed: Preterm labor symptoms and general obstetric precautions including but not limited to vaginal bleeding, contractions, leaking of fluid and fetal movement were reviewed in detail with the patient.  All questions were answered. Has home bp cuff. Rx faxed to . Check bp weekly, let us know if >140/90.   Follow-up: Return in about 1 week (around 04/29/2020) for Routine prenatal care, MD or APP.   Future Appointments  Date Time Provider Department Center  04/25/2020 10:30 AM [facility] NURSE [facility]  04/28/2020 11:00 AM [facility] [place] [facility] None  04/28/2020 11:50 AM Cheral Marker, CNM [facility]  05/01/2020 10:30 AM [facility] N[facility]SE [facility]  05/05/2020  9:00 AM [facility] [place] [facility] None  05/05/2020 10:00 AM Lazaro Arms, MD [facility]  05/08/2020 10:10 AM [facility] NURSE [facility]  05/12/2020  9:00 AM [facility] [place] [facility] None  05/12/2020  9:50 AM Jacklyn Shell, CNM [facility]  05/15/2020 10:10 AM [facility] NURSE [facility]    Orders Placed This Encounter  Procedures  . POC Urinalysis Dipstick OB   Isa Rankin [facility]  04/22/2020 4:51 PM

## 2020-04-23 ENCOUNTER — Inpatient Hospital Stay (HOSPITAL_COMMUNITY): Payer: BC Managed Care – PPO | Admitting: Anesthesiology

## 2020-04-23 ENCOUNTER — Encounter (HOSPITAL_COMMUNITY): Payer: Self-pay | Admitting: Student

## 2020-04-23 DIAGNOSIS — O99824 Streptococcus B carrier state complicating childbirth: Secondary | ICD-10-CM

## 2020-04-23 DIAGNOSIS — Z3A37 37 weeks gestation of pregnancy: Secondary | ICD-10-CM

## 2020-04-23 DIAGNOSIS — O1092 Unspecified pre-existing hypertension complicating childbirth: Secondary | ICD-10-CM

## 2020-04-23 DIAGNOSIS — O114 Pre-existing hypertension with pre-eclampsia, complicating childbirth: Secondary | ICD-10-CM

## 2020-04-23 LAB — GLUCOSE, CAPILLARY
Glucose-Capillary: 75 mg/dL (ref 70–99)
Glucose-Capillary: 86 mg/dL (ref 70–99)
Glucose-Capillary: 98 mg/dL (ref 70–99)

## 2020-04-23 LAB — CBC WITH DIFFERENTIAL/PLATELET
Abs Immature Granulocytes: 0.05 10*3/uL (ref 0.00–0.07)
Basophils Absolute: 0 10*3/uL (ref 0.0–0.1)
Basophils Relative: 0 %
Eosinophils Absolute: 0.1 10*3/uL (ref 0.0–0.5)
Eosinophils Relative: 1 %
HCT: 32.5 % — ABNORMAL LOW (ref 36.0–46.0)
Hemoglobin: 9.7 g/dL — ABNORMAL LOW (ref 12.0–15.0)
Immature Granulocytes: 1 %
Lymphocytes Relative: 25 %
Lymphs Abs: 2.3 10*3/uL (ref 0.7–4.0)
MCH: 23.5 pg — ABNORMAL LOW (ref 26.0–34.0)
MCHC: 29.8 g/dL — ABNORMAL LOW (ref 30.0–36.0)
MCV: 78.9 fL — ABNORMAL LOW (ref 80.0–100.0)
Monocytes Absolute: 0.9 10*3/uL (ref 0.1–1.0)
Monocytes Relative: 9 %
Neutro Abs: 5.9 10*3/uL (ref 1.7–7.7)
Neutrophils Relative %: 64 %
Platelets: 189 10*3/uL (ref 150–400)
RBC: 4.12 MIL/uL (ref 3.87–5.11)
RDW: 19.3 % — ABNORMAL HIGH (ref 11.5–15.5)
WBC: 9.2 10*3/uL (ref 4.0–10.5)
nRBC: 0 % (ref 0.0–0.2)

## 2020-04-23 LAB — CBC
HCT: 33.1 % — ABNORMAL LOW (ref 36.0–46.0)
Hemoglobin: 9.7 g/dL — ABNORMAL LOW (ref 12.0–15.0)
MCH: 23.2 pg — ABNORMAL LOW (ref 26.0–34.0)
MCHC: 29.3 g/dL — ABNORMAL LOW (ref 30.0–36.0)
MCV: 79.2 fL — ABNORMAL LOW (ref 80.0–100.0)
Platelets: 190 10*3/uL (ref 150–400)
RBC: 4.18 MIL/uL (ref 3.87–5.11)
RDW: 19.4 % — ABNORMAL HIGH (ref 11.5–15.5)
WBC: 8.1 10*3/uL (ref 4.0–10.5)
nRBC: 0 % (ref 0.0–0.2)

## 2020-04-23 LAB — RPR: RPR Ser Ql: NONREACTIVE

## 2020-04-23 MED ORDER — MISOPROSTOL 50MCG HALF TABLET: 50.0000 ug | ORAL_TABLET | Freq: Once | ORAL | Status: AC

## 2020-04-23 MED ORDER — ONDANSETRON HCL 4 MG/2ML IJ SOLN: 4.0000 mg | INTRAMUSCULAR | Status: DC | PRN

## 2020-04-23 MED ORDER — MAGNESIUM SULFATE 40 GM/1000ML IV SOLN
2.0000 g/h | INTRAVENOUS | Status: DC
  Administered 2020-04-23: 2 g/h via INTRAVENOUS
  Filled 2020-04-23: qty 1000

## 2020-04-23 MED ORDER — IBUPROFEN 600 MG PO TABS
600.0000 mg | ORAL_TABLET | Freq: Four times a day (QID) | ORAL | Status: DC
  Administered 2020-04-23 – 2020-04-25 (×7): 600 mg via ORAL
  Filled 2020-04-23 (×7): qty 1

## 2020-04-23 MED ORDER — DIBUCAINE (PERIANAL) 1 % EX OINT: 1.0000 "application " | TOPICAL_OINTMENT | CUTANEOUS | Status: DC | PRN

## 2020-04-23 MED ORDER — FENTANYL CITRATE (PF) 100 MCG/2ML IJ SOLN
INTRAMUSCULAR | Status: AC
  Administered 2020-04-23: 100 ug via INTRAVENOUS
  Filled 2020-04-23: qty 2

## 2020-04-23 MED ORDER — ZOLPIDEM TARTRATE 5 MG PO TABS: 5.0000 mg | ORAL_TABLET | Freq: Every evening | ORAL | Status: DC | PRN

## 2020-04-23 MED ORDER — ACETAMINOPHEN 325 MG PO TABS: 650.0000 mg | ORAL_TABLET | ORAL | Status: DC | PRN

## 2020-04-23 MED ORDER — LIDOCAINE HCL (PF) 1 % IJ SOLN
INTRAMUSCULAR | Status: DC | PRN
  Administered 2020-04-23: 8 mL via EPIDURAL

## 2020-04-23 MED ORDER — PHENYLEPHRINE 40 MCG/ML (10ML) SYRINGE FOR IV PUSH (FOR BLOOD PRESSURE SUPPORT): 80.0000 ug | PREFILLED_SYRINGE | INTRAVENOUS | Status: DC | PRN

## 2020-04-23 MED ORDER — FENTANYL-BUPIVACAINE-NACL 0.5-0.125-0.9 MG/250ML-% EP SOLN: 12.0000 mL/h | EPIDURAL | Status: DC | PRN

## 2020-04-23 MED ORDER — OXYCODONE-ACETAMINOPHEN 5-325 MG PO TABS
1.0000 | ORAL_TABLET | ORAL | Status: DC | PRN
  Administered 2020-04-24: 1 via ORAL
  Filled 2020-04-23: qty 1

## 2020-04-23 MED ORDER — OXYTOCIN-SODIUM CHLORIDE 30-0.9 UT/500ML-% IV SOLN
1.0000 m[IU]/min | INTRAVENOUS | Status: DC
  Administered 2020-04-23: 2 m[IU]/min via INTRAVENOUS

## 2020-04-23 MED ORDER — WITCH HAZEL-GLYCERIN EX PADS: 1.0000 "application " | MEDICATED_PAD | CUTANEOUS | Status: DC | PRN

## 2020-04-23 MED ORDER — BENZOCAINE-MENTHOL 20-0.5 % EX AERO: 1.0000 "application " | INHALATION_SPRAY | CUTANEOUS | Status: DC | PRN

## 2020-04-23 MED ORDER — LABETALOL HCL 200 MG PO TABS
200.0000 mg | ORAL_TABLET | Freq: Two times a day (BID) | ORAL | Status: DC
  Administered 2020-04-23 – 2020-04-25 (×4): 200 mg via ORAL
  Filled 2020-04-23 (×4): qty 1

## 2020-04-23 MED ORDER — EPHEDRINE 5 MG/ML INJ: 10.0000 mg | INTRAVENOUS | Status: DC | PRN

## 2020-04-23 MED ORDER — ENALAPRIL MALEATE 5 MG PO TABS: 5.0000 mg | ORAL_TABLET | Freq: Every day | ORAL | Status: DC

## 2020-04-23 MED ORDER — FENTANYL CITRATE (PF) 100 MCG/2ML IJ SOLN
100.0000 ug | INTRAMUSCULAR | Status: DC | PRN
  Administered 2020-04-23 (×3): 100 ug via INTRAVENOUS
  Filled 2020-04-23 (×3): qty 2

## 2020-04-23 MED ORDER — ONDANSETRON HCL 4 MG PO TABS: 4.0000 mg | ORAL_TABLET | ORAL | Status: DC | PRN

## 2020-04-23 MED ORDER — LACTATED RINGERS IV SOLN: INTRAVENOUS | Status: AC

## 2020-04-23 MED ORDER — OXYCODONE-ACETAMINOPHEN 5-325 MG PO TABS: 2.0000 | ORAL_TABLET | ORAL | Status: DC | PRN

## 2020-04-23 MED ORDER — LACTATED RINGERS IV SOLN: 500.0000 mL | Freq: Once | INTRAVENOUS | Status: DC

## 2020-04-23 MED ORDER — TETANUS-DIPHTH-ACELL PERTUSSIS 5-2.5-18.5 LF-MCG/0.5 IM SUSY: 0.5000 mL | PREFILLED_SYRINGE | Freq: Once | INTRAMUSCULAR | Status: DC

## 2020-04-23 MED ORDER — PRENATAL MULTIVITAMIN CH
1.0000 | ORAL_TABLET | Freq: Every day | ORAL | Status: DC
  Administered 2020-04-23 – 2020-04-24 (×2): 1 via ORAL
  Filled 2020-04-23 (×2): qty 1

## 2020-04-23 MED ORDER — SODIUM CHLORIDE (PF) 0.9 % IJ SOLN
INTRAMUSCULAR | Status: DC | PRN
  Administered 2020-04-23: 12 mL/h via EPIDURAL

## 2020-04-23 MED ORDER — DIPHENHYDRAMINE HCL 50 MG/ML IJ SOLN: 12.5000 mg | INTRAMUSCULAR | Status: DC | PRN

## 2020-04-23 MED ORDER — DIPHENHYDRAMINE HCL 25 MG PO CAPS
25.0000 mg | ORAL_CAPSULE | Freq: Four times a day (QID) | ORAL | Status: DC | PRN
Start: 2020-04-23 — End: 2020-04-25
  Administered 2020-04-23: 25 mg via ORAL
  Filled 2020-04-23: qty 1

## 2020-04-23 MED ORDER — MAGNESIUM SULFATE 40 GM/1000ML IV SOLN
2.0000 g/h | INTRAVENOUS | Status: AC
  Administered 2020-04-23: 2 g/h via INTRAVENOUS

## 2020-04-23 MED ORDER — SENNOSIDES-DOCUSATE SODIUM 8.6-50 MG PO TABS
2.0000 | ORAL_TABLET | Freq: Every day | ORAL | Status: DC
  Administered 2020-04-24 – 2020-04-25 (×2): 2 via ORAL
  Filled 2020-04-23 (×3): qty 2

## 2020-04-23 MED ORDER — SIMETHICONE 80 MG PO CHEW: 80.0000 mg | CHEWABLE_TABLET | ORAL | Status: DC | PRN

## 2020-04-23 MED ORDER — COCONUT OIL OIL
1.0000 "application " | TOPICAL_OIL | Status: DC | PRN
  Administered 2020-04-24: 1 via TOPICAL

## 2020-04-23 MED ORDER — MISOPROSTOL 50MCG HALF TABLET
ORAL_TABLET | ORAL | Status: AC
  Filled 2020-04-23: qty 1

## 2020-04-23 NOTE — Progress Notes (Signed)
Jackie Rojas is a 35 y.o. F6E3329 at [redacted]w[redacted]d by early [place] admitted for induction of labor due to [facility] w/SIPE.  Subjective: Doing well without complaints.  Objective: BP (!) 142/89   Pulse 83   Temp 98.6 F (37 C) (Oral)   Resp 18   Ht 5\' 6"  (1.676 m)   Wt (!) 144.2 kg   LMP 07/17/2019 (Approximate)   SpO2 100%   BMI 51.33 kg/m  No intake/output data recorded. Total I/O In: 1774.3 [P.O.:440; I.V.:1034.3; IV Piggyback:300] Out: 700 [Urine:700]  FHT:  FHR: 130 bpm, variability: moderate,  accelerations:  Present,  decelerations:  Absent UC: q1-4 min SVE:   Dilation: 3 Effacement (%): 20 Station: -2 Exam by:: Dr. [email or web address]  Labs: Lab Results  Component Value Date   WBC 7.7 04/22/2020   HGB 9.7 (L) 04/22/2020   HCT 32.1 (L) 04/22/2020   MCV 78.5 (L) 04/22/2020   PLT 191 04/22/2020    Assessment / Plan: 35 yo G5P3013 at [redacted]w[redacted]d here for IOL d/t cHTN w/ SIPE.  Induction: S/p cyto x1. Given cervical exam will re-dose cytotec and likely start pitocin at next exam. cHTN w/ SIPE: BP stable, no severe range. Diagnosis based on headache on admit, since resolved. PreE labs unremarkable, p/c pending. Continue home labetalol 400mg  TID for BP, on Mg. Anti-htn protocol, has not required IV meds. Fetal Wellbeing:  Category I Pain Control:  PRN, desires epidural I/D:  GBS+, PCN, adequate prophylaxis GDMA2: Patient on 1/2 home dose of NPH at 13U, CBG checks q4hours until active labor, then q2h. 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 MDD: Stable on zoloft. SW consult pp.  [redacted]w[redacted]d 04/23/2020, 1:53 AM

## 2020-04-23 NOTE — Lactation Note (Signed)
This note was copied from a baby's chart. Lactation Consultation Note  Patient Name: Jackie Rojas YQIHK'[id number] Date: 04/23/2020 Reason for consult: Initial assessment Age:37 hours  Initial visit to 3 hours old infant of a P4 mother. Infant is latched to right breast, football position upon arrival. Mother states infant latched and was skin to skin after the delivery. Mother shares she has breastfed her 3 children for 1 month and then transitions to formula feeding. Per mother: I want them to have the good benefits of colostrum and first breastmilk.  Infant unlatched but continues showing hunger cues. Mother explains infant has been feeding for ~79minutes. Talked to mother about hand expression and demonstrated technique. Colostrum easily expressed and collected ~80mL in a spoon. LC spoonfed infant. Reviewed with mother average size of a NB stomach. Talked about infant's hunger and fullness cues.  Briefly discussed supplementation volume during first 24 HOL. Reviewed newborn behavior and expectations during first days of life.    Plan: 1-Breastfeeding on demand, ensuring a deep, comfortable latch.  2-Offer breast 8-12 times in 24h period to establish good milk supply. 3-Undressing infant and place skin to skin when ready to breastfeed 4-Keep infant awake during breastfeeding session: massaging breast, infant's hand/shoulder/feet 5-If needed, supplement with formula following supplementation volume.  6-Monitor voids and stools as signs good intake.  7-Encouraged maternal rest, hydration and food intake.  8-Contact LC as needed for feeds/support/concerns/questions   All questions answered at this time. Provided Lactation services brochure.  Maternal Data Formula Feeding for Exclusion: No Has patient been taught Hand Expression?: Yes Does the patient have breastfeeding experience prior to this delivery?: Yes  Feeding Feeding Type: Breast Fed  LATCH Score Latch: Grasps breast easily,  tongue down, lips flanged, rhythmical sucking.  Audible Swallowing: Spontaneous and intermittent  Type of Nipple: Everted at rest and after stimulation  Comfort (Breast/Nipple): Soft / non-tender  Hold (Positioning): No assistance needed to correctly position infant at breast.  LATCH Score: 10  Interventions Interventions: Breast feeding basics reviewed;Skin to skin;Hand express;Breast massage;Expressed milk  Lactation Tools Discussed/Used WIC Program: Yes   Consult Status Consult Status: Follow-up Date: 04/24/20 Follow-up type: In-patient    Jackie Rojas A Higuera Ancidey 04/23/2020, 2:07 PM

## 2020-04-23 NOTE — Lactation Note (Signed)
Lactation Consultation Note  Patient Name: Jackie Rojas CNOBS'J Date: 04/23/2020   Age:35 y.o.   LC Initial Visit:  Visited with mother at < 1 hour of life.  Mother was on the phone with family members and asked me if I could wait for my visit.  I was happy to exit her room and allow her family time.  Asked her to inform her RN when she would like a return visit.  Baby was beginning to get fussy and was showing feeding cues.  RN in mother's room for IV assistance.  When she came out of the room she informed me that mother would prefer to have a visit on the [facility] unit instead of now.  I acknowledged her request.  A lactation consultant will visit with her later today.    Maternal Data    Feeding    LATCH Score                   Interventions    Lactation Tools Discussed/Used     Consult Status Consult Status: Follow-up Date: 04/23/20 Follow-up type: In-patient    Jackie Rojas [name] 04/23/2020, 11:14 AM

## 2020-04-23 NOTE — Progress Notes (Addendum)
Inpatient Diabetes Program Recommendations  AACE/ADA: New Consensus Statement on Inpatient Glycemic Control (2015)  Target Ranges:  Prepandial:   less than 140 mg/dL      Peak postprandial:   less than 180 mg/dL (1-2 hours)      Critically ill patients:  140 - 180 mg/dL   Lab Results  Component Value Date   GLUCAP 86 04/23/2020   HGBA1C 5.8 (H) 10/26/2019    Review of Glycemic Control Results for RANYIA, WITTING (MRN [id number]) as of 04/23/2020 08:50  Ref. Range 04/23/2020 00:57 04/23/2020 05:37 04/23/2020 08:47  Glucose-Capillary Latest Ref Range: 70 - 99 mg/dL 75 98 86   Diabetes history: A2DM Outpatient Diabetes medications: NPH 26 units QHS Pre-pregnant- none Current orders for Inpatient glycemic control: NPH 13 units QHS, Novolog 0-14 units Q4H  Inpatient Diabetes Program Recommendations:    Noted consult. In agreement with current plan.   Would discontinue NPH 13 units QHS dose for tonight given active labor and current trends to avoid episodes of hypoglycemia.   Thanks, Lujean Rave, MSN, RNC-OB Diabetes Coordinator [phone] (8a-5p)

## 2020-04-23 NOTE — Progress Notes (Signed)
Patient ID: Jackie Rojas, female   DOB: [birth year], 35 y.o.   MRN: [id number]  Patient comfortable, but feeling pressure  BP 129/62   Pulse 76   Temp 98.2 F (36.8 C) (Oral)   Resp 16   Ht 5\' 6"  (1.676 m)   Wt (!) 144.2 kg   LMP 07/17/2019 (Approximate)   SpO2 100%   BMI 51.33 kg/m   Dilation: 6 Effacement (%): 90 Cervical Position: Middle Station: -1 Presentation: Vertex Exam by:: Dr [email or web address]  FHT 120s, mod variability, no accel. No decel.  AROM with clear fluid. FSE placed without difficulty  Anticipate vaginal delivery  Adrian Blackwater, DO 04/23/2020 9:42 AM

## 2020-04-23 NOTE — Anesthesia Preprocedure Evaluation (Signed)
Anesthesia Evaluation  Patient identified by MRN, date of birth, ID band Patient awake    Reviewed: Allergy & Precautions, H&P , NPO status , Patient's Chart, lab work & pertinent test results, reviewed documented beta blocker date and time   Airway Mallampati: II  TM Distance: >3 FB Neck ROM: full    Dental no notable dental hx. (+) Teeth Intact, Dental Advisory Given   Pulmonary neg pulmonary ROS,    Pulmonary exam normal breath sounds clear to auscultation       Cardiovascular hypertension, Pt. on medications and Pt. on home beta blockers Normal cardiovascular exam Rhythm:regular Rate:Normal     Neuro/Psych  Headaches, PSYCHIATRIC DISORDERS Anxiety Depression    GI/Hepatic negative GI ROS, Neg liver ROS,   Endo/Other  diabetes, GestationalMorbid obesity  Renal/GU negative Renal ROS  negative genitourinary   Musculoskeletal   Abdominal (+) + obese,   Peds  Hematology negative hematology ROS (+) Silent carrier for alpha-thalassemia   Anesthesia Other Findings Chronic hypertension with superimposed preeclampsia  Reproductive/Obstetrics (+) Pregnancy                             Anesthesia Physical Anesthesia Plan  ASA: III  Anesthesia Plan: Epidural   Post-op Pain Management:    Induction:   PONV Risk Score and Plan: 2  Airway Management Planned: Natural Airway  Additional Equipment:   Intra-op Plan:   Post-operative Plan:   Informed Consent: I have reviewed the patients History and Physical, chart, labs and discussed the procedure including the risks, benefits and alternatives for the proposed anesthesia with the patient or authorized representative who has indicated his/her understanding and acceptance.     Dental Advisory Given  Plan Discussed with: Anesthesiologist  Anesthesia Plan Comments: (Labs checked- platelets confirmed with RN in room. Fetal heart tracing, per  RN, reported to be stable enough for sitting procedure. Discussed epidural, and patient consents to the procedure:  included risk of possible headache,backache, failed block, allergic reaction, and nerve injury. This patient was asked if she had any questions or concerns before the procedure started.)        Anesthesia Quick Evaluation

## 2020-04-23 NOTE — Anesthesia Procedure Notes (Signed)
Epidural Patient location during procedure: OB Start time: 04/23/2020 8:13 AM End time: 04/23/2020 8:20 AM  Staffing Anesthesiologist: Bethena Midget, MD  Preanesthetic Checklist Completed: patient identified, IV checked, site marked, risks and benefits discussed, surgical consent, monitors and equipment checked, pre-op evaluation and timeout performed  Epidural Patient position: sitting Prep: DuraPrep and site prepped and draped Patient monitoring: continuous pulse ox and blood pressure Approach: midline Location: L3-L4 Injection technique: LOR air  Needle:  Needle type: Tuohy  Needle gauge: 17 G Needle length: 9 cm and 9 Needle insertion depth: 9 cm Catheter type: closed end flexible Catheter size: 19 Gauge Catheter at skin depth: 15 cm Test dose: negative  Assessment Events: blood not aspirated, injection not painful, no injection resistance, no paresthesia and negative IV test  Additional Notes 9++ cm

## 2020-04-23 NOTE — Discharge Summary (Signed)
Postpartum Discharge Summary     Patient Name: Jackie Rojas DOB: [birth year] MRN: [id number]  Date of admission: 04/22/2020 Delivery date:04/23/2020  Delivering provider: Truett Mainland  Date of discharge: 04/25/2020  Admitting diagnosis: Pregnancy at 51/5. Severe pre-eclampsia (HA) superimposed on chronic HTN Secondary diagnosis: GDMa2 (insulin). Depression/anxiety. BMI 50s. AMA. History of HSV. History of COVID (03/2019).     Discharge diagnosis: Term Pregnancy Delivered, CHTN with superimposed preeclampsia and GDM A2                                              Post partum procedures:none Augmentation: AROM, Pitocin and Cytotec Complications: None  Hospital course: Induction of Labor With Vaginal Delivery   35 y.o. yo [id number] at 78w6dwas admitted to the hospital 04/22/2020 for induction of labor.  Indication for induction: A2 DM and Chronic hypertension with superimposed preeclampsia.  Patient had an uncomplicated labor course as follows: Membrane Rupture Time/Date: 9:35 AM ,04/23/2020   Delivery Method:Vaginal, Spontaneous  Episiotomy: None  Lacerations:  None  Details of delivery can be found in separate delivery note.  Patient had a routine postpartum course: she received 24 hours of PP Mg and was titrated down on her labetalol to 400 bid from 400 tid as an outpatient. She was taken off her insulin and her blood sugars were normal.  Patient is discharged home 04/25/20.  Newborn Data: Birth date:04/23/2020  Birth time:10:15 AM  Gender:Female  Living status:Living  Apgars:8 ,9  Weight:2810 g   Magnesium Sulfate received: Yes: Seizure prophylaxis BMZ received: No Rhophylac:N/A MMR:No T-DaP:Given prenatally Flu: Yes Transfusion:No  Physical exam  Vitals:   04/24/20 1948 04/24/20 2333 04/25/20 0401 04/25/20 0822  BP: 140/73 (!) 148/92 134/70 (!) 148/89  Pulse: 79 82 83 75  Resp: 20 20 20 19   Temp: 98.4 F (36.9 C) 98.3 F (36.8 C) 98.5 F (36.9 C) 98.6 F (37 C)   TempSrc: Oral Oral Oral Oral  SpO2: 100% 99% 99% 100%  Weight:      Height:       General: alert Lochia: appropriate Uterine Fundus: obese, nttp Incision: N/A DVT Evaluation: No evidence of DVT seen on physical exam. Labs: Lab Results  Component Value Date   WBC 9.1 04/24/2020   HGB 9.3 (L) 04/24/2020   HCT 29.8 (L) 04/24/2020   MCV 78.0 (L) 04/24/2020   PLT 193 04/24/2020   CMP Latest Ref Rng & Units 04/24/2020  Glucose 70 - 99 mg/dL 97  BUN 6 - 20 mg/dL 6  Creatinine 0.44 - 1.00 mg/dL 0.87  Sodium 135 - 145 mmol/L 137  Potassium 3.5 - 5.1 mmol/L 3.8  Chloride 98 - 111 mmol/L 105  CO2 22 - 32 mmol/L 24  Calcium 8.9 - 10.3 mg/dL 7.5(L)  Total Protein 6.5 - 8.1 g/dL 5.7(L)  Total Bilirubin 0.3 - 1.2 mg/dL 0.3  Alkaline Phos 38 - 126 U/L 122  AST 15 - 41 U/L 16  ALT 0 - 44 U/L 9   Edinburgh Score: Edinburgh Postnatal Depression Scale Screening Tool 04/24/2020  I have been able to laugh and see the funny side of things. 0  I have looked forward with enjoyment to things. 0  I have blamed myself unnecessarily when things went wrong. 1  I have been anxious or worried for no good reason. 2  I have felt scared or  panicky for no good reason. 0  Things have been getting on top of me. 0  I have been so unhappy that I have had difficulty sleeping. 1  I have felt sad or miserable. 1  I have been so unhappy that I have been crying. 2  The thought of harming myself has occurred to me. 0  Edinburgh Postnatal Depression Scale Total 7     After visit meds:  Allergies as of 04/25/2020   No Known Allergies     Medication List    STOP taking these medications   insulin NPH Human 100 UNIT/ML injection Commonly known as: NOVOLIN N   pseudoephedrine 60 MG tablet Commonly known as: SUDAFED   valACYclovir 500 MG tablet Commonly known as: VALTREX     TAKE these medications   Accu-Chek Guide Me w/Device Kit 1 each by Does not apply route 4 (four) times daily.   Accu-Chek  Guide test strip Generic drug: glucose blood Use as instructed to check blood sugar 4 times daily   Accu-Chek Softclix Lancets lancets Use as instructed to check blood sugar 4 times daily   acetaminophen 500 MG tablet Commonly known as: TYLENOL Take 500 mg by mouth every 6 (six) hours as needed.   Blood Pressure Cuff Misc 1 Device by Does not apply route once a week.   butalbital-acetaminophen-caffeine 50-325-40 MG tablet Commonly known as: FIORICET Take 1-2 tablets by mouth every 6 (six) hours as needed for headache.   calcium carbonate 750 MG chewable tablet Commonly known as: TUMS EX Chew 1 tablet by mouth daily.   docusate sodium 100 MG capsule Commonly known as: COLACE Take 1 capsule (100 mg total) by mouth 2 (two) times daily. Start taking on: April 26, 2020   ferrous sulfate 325 (65 FE) MG tablet Take 1 tablet (325 mg total) by mouth every other day.   ibuprofen 600 MG tablet Commonly known as: ADVIL Take 1 tablet (600 mg total) by mouth every 6 (six) hours as needed.   labetalol 200 MG tablet Commonly known as: NORMODYNE Take 2 tablets (400 mg total) by mouth 2 (two) times daily. What changed: when to take this   PRENATAL VITAMINS PO Take 1 tablet by mouth daily.   sertraline 25 MG tablet Commonly known as: ZOLOFT Take 1 tablet (25 mg total) by mouth daily.        Discharge home in stable condition Infant Feeding: formula and breast Infant Disposition:home with mother Discharge instruction: per After Visit Summary and Postpartum booklet. Activity: Advance as tolerated. Pelvic rest for 6 weeks.  Diet: routine diet Future Appointments: Future Appointments  Date Time Provider [place]  04/30/2020  9:50 AM [facility] NURSE [facility]  05/28/2020  1:50 PM Myrtis Ser, CNM [facility]   Follow up Visit:  Follow-up Information    [facility]. Go in 5 day(s).   Specialty: Obstetrics and Gynecology Why: blood pressure and  blood sugar check Contact information: [place] [place] [phone]               Please schedule this patient for a In person postpartum visit in 4 weeks with the following provider: Any provider. Additional Postpartum F/U:BP check 1 week  High risk pregnancy complicated by: GDM and HTN Delivery mode:  Vaginal, Spontaneous  Anticipated Birth Control:  would like a BTL. She needs papers signed. No interval method currently.   04/25/2020 Aletha Halim, MD

## 2020-04-23 NOTE — Progress Notes (Signed)
Sonny Poth is a 35 y.o. K3E7614 at [redacted]w[redacted]d by early [place] admitted for induction of labor due to [facility] w/SIPE.  Subjective: Doing well without complaints.  Objective: BP 132/67   Pulse 80   Temp 98.2 F (36.8 C) (Oral)   Resp 15   Ht 5\' 6"  (1.676 m)   Wt (!) 144.2 kg   LMP 07/17/2019 (Approximate)   SpO2 100%   BMI 51.33 kg/m  No intake/output data recorded. Total I/O In: 2559.2 [P.O.:600; I.V.:1609.2; IV Piggyback:350] Out: 1650 [Urine:1650]  FHT:  FHR: 120 bpm, variability: moderate,  accelerations:  Present,  decelerations:  Absent UC: q1-4 min SVE:   Dilation: 4 Effacement (%): 80 Station: -3 Exam by:: Dr. [email or web address]  Labs: Lab Results  Component Value Date   WBC 7.7 04/22/2020   HGB 9.7 (L) 04/22/2020   HCT 32.1 (L) 04/22/2020   MCV 78.5 (L) 04/22/2020   PLT 191 04/22/2020    Assessment / Plan: 35 yo G5P3013 at [redacted]w[redacted]d here for IOL d/t cHTN w/ SIPE.  Induction: S/p cyto x2. Given CE, will start low dose pitocin and titrate as able. cHTN w/ SIPE: BP stable, no severe range. Diagnosis based on headache on admit, since resolved. PreE labs unremarkable, p/c pending. Continue home labetalol 400mg  TID for BP, on Mg. Anti-htn protocol, has not required IV meds. Fetal Wellbeing:  Category I Pain Control:  PRN, desires epidural I/D:  GBS+, PCN, adequate prophylaxis GDMA2: Patient on 1/2 home dose of NPH at 13U, CBG checks q4hours until active labor, then q2h. 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 MDD: Stable on zoloft. SW consult pp.  [redacted]w[redacted]d 04/23/2020, 5:54 AM

## 2020-04-24 ENCOUNTER — Other Ambulatory Visit: Payer: BC Managed Care – PPO

## 2020-04-24 LAB — CBC
HCT: 29.8 % — ABNORMAL LOW (ref 36.0–46.0)
Hemoglobin: 9.3 g/dL — ABNORMAL LOW (ref 12.0–15.0)
MCH: 24.3 pg — ABNORMAL LOW (ref 26.0–34.0)
MCHC: 31.2 g/dL (ref 30.0–36.0)
MCV: 78 fL — ABNORMAL LOW (ref 80.0–100.0)
Platelets: 193 10*3/uL (ref 150–400)
RBC: 3.82 MIL/uL — ABNORMAL LOW (ref 3.87–5.11)
RDW: 19.4 % — ABNORMAL HIGH (ref 11.5–15.5)
WBC: 9.1 10*3/uL (ref 4.0–10.5)
nRBC: 0 % (ref 0.0–0.2)

## 2020-04-24 LAB — COMPREHENSIVE METABOLIC PANEL
ALT: 9 U/L (ref 0–44)
AST: 16 U/L (ref 15–41)
Albumin: 2.2 g/dL — ABNORMAL LOW (ref 3.5–5.0)
Alkaline Phosphatase: 122 U/L (ref 38–126)
Anion gap: 8 (ref 5–15)
BUN: 6 mg/dL (ref 6–20)
CO2: 24 mmol/L (ref 22–32)
Calcium: 7.5 mg/dL — ABNORMAL LOW (ref 8.9–10.3)
Chloride: 105 mmol/L (ref 98–111)
Creatinine, Ser: 0.87 mg/dL (ref 0.44–1.00)
GFR, Estimated: >60 mL/min (ref >60)
Glucose, Bld: 97 mg/dL (ref 70–99)
Potassium: 3.8 mmol/L (ref 3.5–5.1)
Sodium: 137 mmol/L (ref 135–145)
Total Bilirubin: 0.3 mg/dL (ref 0.3–1.2)
Total Protein: 5.7 g/dL — ABNORMAL LOW (ref 6.5–8.1)

## 2020-04-24 NOTE — Progress Notes (Signed)
POSTPARTUM PROGRESS NOTE  Post Partum Day 1  Subjective:  Jackie Rojas is a 35 y.o. [id number] s/p SVD at [redacted]w[redacted]d.  She reports she is doing well. No acute events overnight. She denies any problems with ambulating, voiding or po intake. Denies nausea or vomiting.  Pain is well controlled.  Lochia is light.  Objective: Blood pressure 137/69, pulse 76, temperature 98 F (36.7 C), temperature source Oral, resp. rate 20, height 5\' 6"  (1.676 m), weight (!) 144.2 kg, last menstrual period 07/17/2019, SpO2 100 %, unknown if currently breastfeeding.  Physical Exam:  General: alert, cooperative and no distress Chest: no respiratory distress Heart:regular rate, distal pulses intact, 3/6 systolic murmur Abdomen: soft, nontender,  Uterine Fundus: firm, appropriately tender DVT Evaluation: No calf swelling or tenderness Extremities: minimal edema Skin: warm, dry  Recent Labs    04/23/20 0617 04/23/20 1135  HGB 9.7* 9.7*  HCT 32.5* 33.1*    Assessment/Plan: Jackie Rojas is a 35 y.o. 31 s/p SVD at [redacted]w[redacted]d   PPD#1 - Doing well  Routine postpartum care Magnesium off at 10 AM BP controlled with labetalol, may change to procardia or amlodipine   LOS: 2 days   [redacted]w[redacted]d, MD Faculty attending 04/24/2020, 7:09 AM

## 2020-04-24 NOTE — Lactation Note (Signed)
This note was copied from a baby's chart. Lactation Consultation Note  Patient Name: Jackie Rojas IAXKP'V Date: 04/24/2020 Reason for consult: Follow-up assessment Age:37 hours  Follow up visit to 28 hours old infant with 2.46% weight loss at the time of consult. Mother states infant is breastfeeding well but she has noticed she is not producing as much milk as yesterday. Mother has a DEBP set up in room but has not used it.  Mother reports supplementation with formula is going well although infant seems to prefer latching on. Per mother, infant is having voids and stools.  Reinforced the importance of preserving infant's energy, making feedings effective and less than 30 minutes.  Encouraged mother to contact [facility] for support and questions as well as assistance with latching.   Feeding Feeding Type: Bottle Fed - Formula Nipple Type: Slow - flow  Interventions Interventions: Breast feeding basics reviewed;Skin to skin;DEBP;Expressed milk  Consult Status Consult Status: Follow-up Date: 04/25/20 Follow-up type: In-patient    Lynnet Hefley A Higuera Ancidey 04/24/2020, 3:13 PM

## 2020-04-24 NOTE — Progress Notes (Signed)
MOB was referred for history of depression/anxiety. * Referral screened out by Clinical Social Worker because none of the following criteria appear to apply: ~ History of anxiety/depression during this pregnancy, or of post-partum depression following prior delivery. ~ Diagnosis of anxiety and/or depression within last 3 years OR * MOB's symptoms currently being treated with medication and/or therapy. Per chart review and OB records review, MOB is currently prescribed/taking Zoloft.  Please contact the Clinical Social Worker if needs arise, by MOB request, or if MOB scores greater than 9/yes to question 10 on Edinburgh Postpartum Depression Screen.  Tammye Kahler, LCSW Clinical Social Worker [facility] Cell#: [phone]  

## 2020-04-24 NOTE — Anesthesia Postprocedure Evaluation (Signed)
Anesthesia Post Note  Patient: Jackie Rojas  Procedure(s) Performed: AN AD HOC LABOR EPIDURAL     Patient location during evaluation: OB High Risk Anesthesia Type: Epidural Level of consciousness: awake, awake and alert and oriented Pain management: pain level controlled Vital Signs Assessment: post-procedure vital signs reviewed and stable Respiratory status: spontaneous breathing and respiratory function stable Cardiovascular status: blood pressure returned to baseline Postop Assessment: no headache, epidural receding, patient able to bend at knees, adequate PO intake, no backache, no apparent nausea or vomiting and able to ambulate Anesthetic complications: no   No complications documented.  Last Vitals:  Vitals:   04/24/20 0600 04/24/20 0730  BP:  131/74  Pulse:  84  Resp: 20 20  Temp:  36.6 C  SpO2:  99%    Last Pain:  Vitals:   04/24/20 0730  TempSrc: Oral  PainSc:    Pain Goal: Patients Stated Pain Goal: 0 (04/22/20 2114)                 Peyton Bottoms, Bearl Mulberry

## 2020-04-24 NOTE — Progress Notes (Signed)
Unable to obtain fasting CBG. Pt was eating when RN entered room and pt stated that she drank apple juice with her meds this morning.

## 2020-04-24 NOTE — Plan of Care (Signed)
  Problem: Education: Goal: Knowledge of General Education information will improve Description: Including pain rating scale, medication(s)/side effects and non-pharmacologic comfort measures Outcome: Completed/Met   Problem: Clinical Measurements: Goal: Respiratory complications will improve Outcome: Completed/Met Goal: Cardiovascular complication will be avoided Outcome: Completed/Met   Problem: Activity: Goal: Risk for activity intolerance will decrease Outcome: Completed/Met   Problem: Nutrition: Goal: Adequate nutrition will be maintained Outcome: Completed/Met   Problem: Coping: Goal: Level of anxiety will decrease Outcome: Completed/Met   Problem: Elimination: Goal: Will not experience complications related to bowel motility Outcome: Completed/Met Goal: Will not experience complications related to urinary retention Outcome: Completed/Met   Problem: Pain Managment: Goal: General experience of comfort will improve Outcome: Completed/Met   Problem: Safety: Goal: Ability to remain free from injury will improve Outcome: Completed/Met   Problem: Skin Integrity: Goal: Risk for impaired skin integrity will decrease Outcome: Completed/Met   Problem: Education: Goal: Knowledge of disease or condition will improve Outcome: Completed/Met Goal: Knowledge of the prescribed therapeutic regimen will improve Outcome: Completed/Met

## 2020-04-24 NOTE — Plan of Care (Signed)
  Problem: Nutrition: Goal: Adequate nutrition will be maintained Outcome: Completed/Met   Problem: Education: Goal: Knowledge of General Education information will improve Description: Including pain rating scale, medication(s)/side effects and non-pharmacologic comfort measures Outcome: Completed/Met   Problem: Clinical Measurements: Goal: Respiratory complications will improve Outcome: Completed/Met Goal: Cardiovascular complication will be avoided Outcome: Completed/Met   Problem: Activity: Goal: Risk for activity intolerance will decrease Outcome: Completed/Met   Problem: Nutrition: Goal: Adequate nutrition will be maintained Outcome: Completed/Met   Problem: Coping: Goal: Level of anxiety will decrease Outcome: Completed/Met   Problem: Elimination: Goal: Will not experience complications related to bowel motility Outcome: Completed/Met Goal: Will not experience complications related to urinary retention Outcome: Completed/Met   Problem: Pain Managment: Goal: General experience of comfort will improve Outcome: Completed/Met   Problem: Safety: Goal: Ability to remain free from injury will improve Outcome: Completed/Met   Problem: Skin Integrity: Goal: Risk for impaired skin integrity will decrease Outcome: Completed/Met   Problem: Education: Goal: Knowledge of disease or condition will improve Outcome: Completed/Met Goal: Knowledge of the prescribed therapeutic regimen will improve Outcome: Completed/Met   Problem: Education: Goal: Knowledge of condition will improve Outcome: Completed/Met   Problem: Activity: Goal: Will verbalize the importance of balancing activity with adequate rest periods Outcome: Completed/Met Goal: Ability to tolerate increased activity will improve Outcome: Completed/Met   Problem: Coping: Goal: Ability to identify and utilize available resources and services will improve Outcome: Completed/Met   Problem: Life Cycle: Goal:  Chance of risk for complications during the postpartum period will decrease Outcome: Completed/Met   Problem: Role Relationship: Goal: Ability to demonstrate positive interaction with newborn will improve Outcome: Completed/Met   Problem: Skin Integrity: Goal: Demonstration of wound healing without infection will improve Outcome: Completed/Met

## 2020-04-25 ENCOUNTER — Other Ambulatory Visit: Payer: Medicaid Other

## 2020-04-25 MED ORDER — DOCUSATE SODIUM 100 MG PO CAPS: 100.0000 mg | ORAL_CAPSULE | Freq: Two times a day (BID) | ORAL | Status: DC

## 2020-04-25 MED ORDER — IBUPROFEN 600 MG PO TABS: 600.0000 mg | ORAL_TABLET | Freq: Four times a day (QID) | ORAL | 0 refills | Status: AC | PRN

## 2020-04-25 MED ORDER — FERROUS SULFATE 325 (65 FE) MG PO TABS: 325.0000 mg | ORAL_TABLET | ORAL | Status: DC

## 2020-04-25 MED ORDER — DOCUSATE SODIUM 100 MG PO CAPS: 100.0000 mg | ORAL_CAPSULE | Freq: Two times a day (BID) | ORAL | 0 refills | Status: AC

## 2020-04-25 MED ORDER — LABETALOL HCL 200 MG PO TABS: 400.0000 mg | ORAL_TABLET | Freq: Two times a day (BID) | ORAL | 0 refills | Status: DC

## 2020-04-25 NOTE — Discharge Instructions (Signed)
Take your blood sugars once a day until your next visit  Take your blood pressure once or twice a day. Call the office if the top number is 150 or higher or if the bottom number is 100 or higher  Hypertension During Pregnancy Hypertension is also called high blood pressure. High blood pressure means that the force of your blood moving in your body is too strong. It can cause problems for you and your baby. Different types of high blood pressure can happen during pregnancy. The types are:  High blood pressure before you got pregnant. This is called chronic hypertension.  This can continue during your pregnancy. Your doctor will want to keep checking your blood pressure. You may need medicine to keep your blood pressure under control while you are pregnant. You will need follow-up visits after you have your baby.  High blood pressure that goes up during pregnancy when it was normal before. This is called gestational hypertension. It will usually get better after you have your baby, but your doctor will need to watch your blood pressure to make sure that it is getting better.  Very high blood pressure during pregnancy. This is called preeclampsia. Very high blood pressure is an emergency that needs to be checked and treated right away.  You may develop very high blood pressure after giving birth. This is called postpartum preeclampsia. This usually occurs within 48 hours after childbirth but may occur up to 6 weeks after giving birth. This is rare. How does this affect me? If you have high blood pressure during pregnancy, you have a higher chance of developing high blood pressure:  As you get older.  If you get pregnant again. In some cases, high blood pressure during pregnancy can cause:  Stroke.  Heart attack.  Damage to the kidneys, lungs, or liver.  Preeclampsia.  Jerky movements you cannot control (convulsions or seizures).  Problems with the placenta.   What can I do to lower my  risk?   Keep a healthy weight.  Eat a healthy diet.  Follow what your doctor tells you about treating any medical problems that you had before becoming pregnant. It is very important to go to all of your doctor visits. Your doctor will check your blood pressure and make sure that your pregnancy is progressing as it should. Treatment should start early if a problem is found.   Follow these instructions at home:  Take your blood pressure 1-2 times per day. Call the office if your blood pressure is 155 or higher for the top number or 105 or higher for the bottom number.    Eating and drinking   Drink enough fluid to keep your pee (urine) pale yellow.  Avoid caffeine. Lifestyle  Do not use any products that contain nicotine or tobacco, such as cigarettes, e-cigarettes, and chewing tobacco. If you need help quitting, ask your doctor.  Do not use alcohol or drugs.  Avoid stress.  Rest and get plenty of sleep.  Regular exercise can help. Ask your doctor what kinds of exercise are best for you. General instructions  Take over-the-counter and prescription medicines only as told by your doctor.  Keep all prenatal and follow-up visits as told by your doctor. This is important. Contact a doctor if:  You have symptoms that your doctor told you to watch for, such as: ? Headaches. ? Nausea. ? Vomiting. ? Belly (abdominal) pain. ? Dizziness. ? Light-headedness. Get help right away if:  You have: ? Very  bad belly pain that does not get better with treatment. ? A very bad headache that does not get better. ? Vomiting that does not get better. ? Sudden, fast weight gain. ? Sudden swelling in your hands, ankles, or face. ? Blood in your pee. ? Blurry vision. ? Double vision. ? Shortness of breath. ? Chest pain. ? Weakness on one side of your body. ? Trouble talking. Summary  High blood pressure is also called hypertension.  High blood pressure means that the force of  your blood moving in your body is too strong.  High blood pressure can cause problems for you and your baby.  Keep all follow-up visits as told by your doctor. This is important. This information is not intended to replace advice given to you by your health care provider. Make sure you discuss any questions you have with your health care provider. Document Released: 04/24/2010 Document Revised: 07/13/2018 Document Reviewed: 04/18/2018 Elsevier Patient Education  2020 [employer]. \

## 2020-04-25 NOTE — Progress Notes (Signed)
Discharge instructions given to patient. Reviewed postpartum care, follow up appointments, medication changes, and PPD. Patient verbalized understanding.

## 2020-04-25 NOTE — Lactation Note (Signed)
This note was copied from a baby's chart. Lactation Consultation Note  Patient Name: Jackie Rojas INOMV'E Date: 04/25/2020   Age:37 hours [name] being d/c today.  Mom reports she has a pump at home but mainly plans to bottle feed.  Reports has WIC appt Monday.  Urged mom to go to [employer] on preparing infant formula safely.  Reminded mom it is not an all or nothing thing that any breastmilk was good no matter how Sapphire gets it.   Mom has [facility] breastfeeding Resources for home use.  Maternal Data    Feeding Feeding Type: Bottle Fed - Formula Nipple Type: Slow - flow  LATCH Score                   Interventions    Lactation Tools Discussed/Used     Consult Status      Aldene Hendon [name] 04/25/2020, 10:25 AM

## 2020-04-28 ENCOUNTER — Other Ambulatory Visit: Payer: BC Managed Care – PPO

## 2020-04-28 ENCOUNTER — Encounter: Payer: BC Managed Care – PPO | Admitting: Women's Health

## 2020-04-30 ENCOUNTER — Telehealth: Payer: Self-pay

## 2020-04-30 ENCOUNTER — Telehealth: Payer: Medicaid Other

## 2020-04-30 ENCOUNTER — Encounter (HOSPITAL_COMMUNITY): Payer: Self-pay | Admitting: Obstetrics & Gynecology

## 2020-04-30 ENCOUNTER — Inpatient Hospital Stay (HOSPITAL_COMMUNITY)
Admission: AD | Admit: 2020-04-30 | Discharge: 2020-04-30 | Disposition: A | Discharge status: Home / Self Care | Payer: BC Managed Care – PPO | Attending: Obstetrics & Gynecology | Admitting: Obstetrics & Gynecology

## 2020-04-30 ENCOUNTER — Other Ambulatory Visit: Payer: Self-pay

## 2020-04-30 DIAGNOSIS — I10 Essential (primary) hypertension: Secondary | ICD-10-CM | POA: Insufficient documentation

## 2020-04-30 DIAGNOSIS — O1003 Pre-existing essential hypertension complicating the puerperium: Secondary | ICD-10-CM

## 2020-04-30 DIAGNOSIS — R102 Pelvic and perineal pain: Secondary | ICD-10-CM | POA: Diagnosis not present

## 2020-04-30 DIAGNOSIS — O1093 Unspecified pre-existing hypertension complicating the puerperium: Secondary | ICD-10-CM

## 2020-04-30 DIAGNOSIS — O99893 Other specified diseases and conditions complicating puerperium: Secondary | ICD-10-CM

## 2020-04-30 DIAGNOSIS — R059 Cough, unspecified: Secondary | ICD-10-CM | POA: Insufficient documentation

## 2020-04-30 DIAGNOSIS — O10919 Unspecified pre-existing hypertension complicating pregnancy, unspecified trimester: Secondary | ICD-10-CM

## 2020-04-30 LAB — CBC
HCT: 34 % — ABNORMAL LOW (ref 36.0–46.0)
Hemoglobin: 10.4 g/dL — ABNORMAL LOW (ref 12.0–15.0)
MCH: 24 pg — ABNORMAL LOW (ref 26.0–34.0)
MCHC: 30.6 g/dL (ref 30.0–36.0)
MCV: 78.5 fL — ABNORMAL LOW (ref 80.0–100.0)
Platelets: 271 10*3/uL (ref 150–400)
RBC: 4.33 MIL/uL (ref 3.87–5.11)
RDW: 19.8 % — ABNORMAL HIGH (ref 11.5–15.5)
WBC: 7.7 10*3/uL (ref 4.0–10.5)
nRBC: 0 % (ref 0.0–0.2)

## 2020-04-30 LAB — COMPREHENSIVE METABOLIC PANEL
ALT: 23 U/L (ref 0–44)
AST: 31 U/L (ref 15–41)
Albumin: 2.8 g/dL — ABNORMAL LOW (ref 3.5–5.0)
Alkaline Phosphatase: 100 U/L (ref 38–126)
Anion gap: 11 (ref 5–15)
BUN: 7 mg/dL (ref 6–20)
CO2: 23 mmol/L (ref 22–32)
Calcium: 8.8 mg/dL — ABNORMAL LOW (ref 8.9–10.3)
Chloride: 106 mmol/L (ref 98–111)
Creatinine, Ser: 0.94 mg/dL (ref 0.44–1.00)
GFR, Estimated: >60 mL/min (ref >60)
Glucose, Bld: 91 mg/dL (ref 70–99)
Potassium: 4 mmol/L (ref 3.5–5.1)
Sodium: 140 mmol/L (ref 135–145)
Total Bilirubin: 0.6 mg/dL (ref 0.3–1.2)
Total Protein: 6.5 g/dL (ref 6.5–8.1)

## 2020-04-30 LAB — D-DIMER, QUANTITATIVE: D-Dimer, Quant: 3.7 ug/mL-FEU — ABNORMAL HIGH (ref 0.00–0.50)

## 2020-04-30 MED ORDER — LABETALOL HCL 5 MG/ML IV SOLN: 40.0000 mg | INTRAVENOUS | Status: DC | PRN

## 2020-04-30 MED ORDER — LABETALOL HCL 5 MG/ML IV SOLN: 20.0000 mg | INTRAVENOUS | Status: DC | PRN

## 2020-04-30 MED ORDER — HYDRALAZINE HCL 20 MG/ML IJ SOLN: 10.0000 mg | INTRAMUSCULAR | Status: DC | PRN

## 2020-04-30 MED ORDER — HYDRALAZINE HCL 20 MG/ML IJ SOLN
5.0000 mg | INTRAMUSCULAR | Status: DC | PRN
  Administered 2020-04-30: 5 mg via INTRAVENOUS
  Filled 2020-04-30: qty 1

## 2020-04-30 MED ORDER — LABETALOL HCL 100 MG PO TABS
400.0000 mg | ORAL_TABLET | Freq: Once | ORAL | Status: AC
  Administered 2020-04-30: 400 mg via ORAL
  Filled 2020-04-30: qty 4

## 2020-04-30 MED ORDER — SERTRALINE HCL 25 MG PO TABS
25.0000 mg | ORAL_TABLET | Freq: Once | ORAL | Status: AC
  Administered 2020-04-30: 25 mg via ORAL
  Filled 2020-04-30: qty 1

## 2020-04-30 MED ORDER — LABETALOL HCL 200 MG PO TABS: 400.0000 mg | ORAL_TABLET | Freq: Three times a day (TID) | ORAL | 0 refills | Status: DC

## 2020-04-30 MED ORDER — ENOXAPARIN SODIUM 80 MG/0.8ML ~~LOC~~ SOLN
70.0000 mg | Freq: Once | SUBCUTANEOUS | Status: AC
  Administered 2020-04-30: 70 mg via SUBCUTANEOUS
  Filled 2020-04-30: qty 0.7

## 2020-04-30 NOTE — Discharge Instructions (Signed)
Deep Vein Thrombosis  Deep vein thrombosis (DVT) is a condition in which a blood clot forms in a deep vein, such as a vein in the lower leg, thigh, pelvis, or arm. Deep veins are veins in the deep venous system. A clot is blood that has thickened into a gel or solid. This condition is serious and can be life-threatening if the clot travels to the lungs and causes a blockage (pulmonary embolism) in the arteries of the lung. A DVT can also damage veins in the leg. This can lead to long-term, or chronic, venous disease, leg pain, swelling, discoloration, and ulcers or sores (post-thrombotic syndrome). What are the causes? This condition may be caused by:  A slowdown of blood flow.  Damage to a vein.  A condition that causes blood to clot more easily, such as certain blood-clotting disorders. What increases the risk? The following factors may make you more likely to develop this condition:  Having obesity.  Being older, especially older than age 60.  Being inactive (sedentary lifestyle) or not moving around. This may include: ? Sitting or lying down for longer than 4-6 hours other than to sleep at night. ? Being in the hospital, having major or lengthy surgery, or having a thin, flexible tube (central line catheter) placed in a large vein.  Being pregnant, giving birth, or having recently given birth.  Taking medicines that contain estrogen, such as birth control or hormone replacement therapy.  Using products that contain nicotine or tobacco, especially if you use hormonal birth control.  Having a history of blood clots or a blood-clotting disease, a blood vessel disease (peripheral vascular disease), or congestive heart disease.  Having a history of cancer, especially if being treated with chemotherapy. What are the signs or symptoms? Symptoms of this condition include:  Swelling, pain, pressure, or tenderness in an arm or a leg.  An arm or a leg becoming warm, red, or  discolored.  A leg turning very pale. You may have a large DVT. This is rare. If the clot is in your leg, you may notice symptoms more or have worse symptoms when you stand or walk. In some cases, there are no symptoms. How is this diagnosed? This condition is diagnosed with:  Your medical history and a physical exam.  Tests, such as: ? Blood tests to check how well your blood clots. ? Doppler ultrasound. This is the best way to find a DVT. ? Venogram. Contrast dye is injected into a vein, and X-rays are taken to check for clots. How is this treated? Treatment for this condition depends on:  The cause of your DVT.  The size and location of your DVT, or having more than one DVT.  Your risk for bleeding or developing more clots.  Other medical conditions you may have. Treatment may include:  Taking a blood thinner, also called an anticoagulant, to prevent clots from forming and growing.  Wearing compression stockings, if directed.  Injecting medicines into the affected vein to break up the clot (catheter-directed thrombolysis). This is used only for severe DVT and only if a specialist recommends it.  Specific surgical procedures, when DVT is severe or hard to treat. These may be done to: ? Isolate and remove your clot. ? Place an inferior vena cava (IVC) filter in a large vein to catch blood clots before they reach your lungs. You may get some medical treatments for 6 months or longer. Follow these instructions at home: If you are taking blood thinners:    Talk with your health care provider before you take any medicines that contain aspirin or NSAIDs, such as ibuprofen. These medicines increase your risk for dangerous bleeding.  Take your medicine exactly as told, at the same time every day. Do not skip a dose. Do not take more than the prescribed dose. This is important.  Ask your health care provider about foods and medicines that could change the way your blood thinner  works (may interact). Avoid these foods and medicines if you are told to do so.  Avoid anything that may cause bleeding or bruising. You may bleed more easily while taking blood thinners. ? Be very careful when using knives, scissors, or other sharp objects. ? Use an electric razor instead of a blade. ? Avoid activities that could cause injury or bruising, and follow instructions for preventing falls. ? Tell your health care provider if you have had any internal bleeding, bleeding ulcers, or neurologic diseases, such as strokes or cerebral aneurysms.  Wear a medical alert bracelet or carry a card that lists what medicines you take. General instructions  Take over-the-counter and prescription medicines only as told by your health care provider.  Return to your normal activities as told by your health care provider. Ask your health care provider what activities are safe for you.  If recommended, wear compression stockings as told by your health care provider. These stockings help to prevent blood clots and reduce swelling in your legs.  Keep all follow-up visits as told by your health care provider. This is important. Contact a health care provider if:  You miss a dose of your blood thinner.  You have new or worse pain, swelling, or redness in an arm or a leg.  You have worsening numbness or tingling in an arm or a leg.  You have unusual bruising. Get help right away if:  You have signs or symptoms that a blood clot has moved to the lungs. These may include: ? Shortness of breath. ? Chest pain. ? Fast or irregular heartbeats (palpitations). ? Light-headedness or dizziness. ? Coughing up blood.  You have signs or symptoms that your blood is too thin. These may include: ? Blood in your vomit, stool, or urine. ? A cut that will not stop bleeding. ? A menstrual period that is heavier than usual. ? A severe headache or confusion. These symptoms may represent a serious problem that  is an emergency. Do not wait to see if the symptoms will go away. Get medical help right away. Call your local emergency services (911 in the [place] Do not drive yourself to the hospital. Summary  Deep vein thrombosis (DVT) happens when a blood clot forms in a deep vein. This may occur in the lower leg, thigh, pelvis, or arm.  Symptoms affect the arm or leg and can include swelling, pain, tenderness, warmth, redness, or discoloration.  This condition may be treated with medicines or compression stockings. In severe cases, surgery may be done.  If you are taking blood thinners, take them exactly as told. Do not skip a dose. Do not take more than is prescribed.  Get help right away if you have shortness of breath, chest pain, fast or irregular heartbeats, or blood in your vomit, urine, or stool. This information is not intended to replace advice given to you by your health care provider. Make sure you discuss any questions you have with your health care provider. Document Revised: 03/17/2019 Document Reviewed: 03/17/2019 Elsevier Patient Education  2021 Elsevier   Inc.  

## 2020-04-30 NOTE — Telephone Encounter (Signed)
Patient called in stating her left foot is swollen double the size of her right foot and is painful to touch. First noticed it last night. Also c/o increased bp's. Last night it was 175/114 and today bp was 160/133. Patient currently on labetalol.

## 2020-04-30 NOTE — MAU Note (Signed)
Patient states she has been having ongoing pelvic pain since prior to delivery.  Reports an increase in swelling in her left side and sensitivity on that side.  BP at home was 160/133.  Denies HA/visual disturbances.  Bleeding has been on and off.  Patient delivered SVD on 04/23/20 and was on Magnesium for pre-e.

## 2020-04-30 NOTE — Telephone Encounter (Signed)
Called patient to try and arrive her for her appointment. No answer/vm full and unable to accept messages.

## 2020-04-30 NOTE — Telephone Encounter (Signed)
Returned pt's call and advised that she return to the hospital to be evaluated. Tish RN agreed. She delivered last week.

## 2020-04-30 NOTE — MAU Provider Note (Signed)
Chief Complaint:  Pelvic Pain and Hypertension   Event Date/Time   First Provider Initiated Contact with Patient 04/30/20 1939     HPI: Jackie Rojas is a 35 y.o. [id number] at 7 days postpartum who presents to maternity admissions reporting increased BP at home (160/133),  ongoing pelvic pain that began prior to delivery and is causing pain in all joints in her left leg. She has swelling in the both legs, but it is worse on the left side, especially in her foot. She is concerned because she has a strong family history of blood clots. She also has a mild chronic cough (she says she's had it her entire pregnancy and it has not gotten worse. She denies headache, epigastric pain, visual disturbances, nausea or vomiting. Her baby is breastfeeding well, she is also having to care for her other 3 children so already quite active at home. Her bleeding has continued since discharge, at times heavier but mostly decreasing overall.  Pregnancy Course: complicated by chronic hypertension with superimposed preeclampsia (treated with magnesium in labor) and A2GDM. She was on 440m labetalol TID during the pregnancy and decreased to 400 BID prior to discharge.   Past Medical History:  Diagnosis Date  . Complication of anesthesia   . Gallstones   . Gestational diabetes   . Gestational hypertension    PP  . HSV-2 infection    OB History  Gravida Para Term Preterm AB Living  5 4 4   1 4   SAB IAB Ectopic Multiple Live Births  1     0 3    # Outcome Date GA Lbr Len/2nd Weight Sex Delivery Anes PTL Lv  5 Term 04/23/20 375w6d 00:11 6 lb 3.1 oz (2.81 kg) F Vag-Spont EPI  LIV  4 Term 12/21/16 3932w0d lb 4 oz (2.835 kg) F Vag-Spont EPI N   3 Term 09/26/14 [id number] 6 oz (2.892 kg) F Vag-Spont EPI N LIV  2 SAB 2012 [id number]    1 Term 09/25/08 5w0d32w0d 7 oz (2.92 kg) F Vag-Spont EPI N LIV   Past Surgical History:  Procedure Laterality Date  . CHOLECYSTECTOMY  02/2019  . TONSILLECTOMY      Family History  Problem Relation Age of Onset  . Diabetes Maternal Aunt   . Hypertension Maternal Aunt   . Hypertension Maternal Uncle   . Diabetes Paternal Aunt   . Hypertension Paternal Uncle   . Diabetes Paternal Uncle   . Gout Maternal Grandmother   . Liver disease Maternal Grandmother   . Cancer Paternal Grandmother        bone   Social History   Tobacco Use  . Smoking status: Never Smoker  . Smokeless tobacco: Never Used  Vaping Use  . Vaping Use: Never used  Substance Use Topics  . Alcohol use: Not Currently  . Drug use: Never   No Known Allergies No medications prior to admission.    I have reviewed patient's Past Medical Hx, Surgical Hx, Family Hx, Social Hx, medications and allergies.   ROS:  Review of Systems  HENT: Negative for congestion and sore throat.   Eyes: Negative for photophobia and visual disturbance.  Respiratory: Positive for cough. Negative for shortness of breath.   Cardiovascular: Positive for leg swelling.  Gastrointestinal: Negative for abdominal pain, diarrhea, nausea and vomiting.  Genitourinary: Positive for pelvic pain and vaginal bleeding (lochia).  Neurological: Negative for dizziness, syncope, light-headedness  and headaches.  All other systems reviewed and are negative.  Physical Exam   04/30/20 2200 -- 81 -- -- 146/73Abnormal  04/30/20 2140 -- 80 -- -- 165/88Abnormal  04/30/20 2100 -- 79 -- -- 156/87Abnormal  04/30/20 2050 -- -- -- -- 155/103Abnormal  04/30/20 2001 -- 75 -- -- 159/84Abnormal  04/30/20 1931 -- 82 -- -- 147/92Abnormal  04/30/20 1916 -- 72 -- -- 177/90Abnormal  04/30/20 1901 -- 75 -- -- --  04/30/20 1901 -- -- -- -- 173/88Abnormal  04/30/20 1852 -- 73 -- -- 158/84Abnormal  04/30/20 1835 98.3 F (36.8 C) -- -- -- 170/90Abnormal   Constitutional: Well-developed, well-nourished female in no acute distress.  Cardiovascular: normal rate & rhythm Respiratory: normal effort, lung sounds clear  throughout GI: Abd soft, non-tender, pos BS x 4 MS: Extremities nontender, +1 pitting edema in both legs, +2 in her left foot, normal ROM (but painful to lift her left leg at the hip) Neurologic: Alert and oriented x 4.  GU: no CVA tenderness Pelvic exam deferred   Labs: WBC 4.0 - 10.5 K/uL 7.7   RBC 3.87 - 5.11 MIL/uL 4.33   Hemoglobin 12.0 - 15.0 g/dL 10.4Low   HCT 36.0 - 46.0 % 34.0Low   MCV 80.0 - 100.0 fL 78.5Low   MCH 26.0 - 34.0 pg 24.0Low   MCHC 30.0 - 36.0 g/dL 30.6   RDW 11.5 - 15.5 % 19.8High   Platelets 150 - 400 K/uL 271   nRBC 0.0 - 0.2 % 0.0    Sodium 135 - 145 mmol/L 140   Potassium 3.5 - 5.1 mmol/L 4.0   Chloride 98 - 111 mmol/L 106   CO2 22 - 32 mmol/L 23   Glucose, Bld 70 - 99 mg/dL 91   Comment: Glucose reference range applies only to samples taken after fasting for at least 8 hours.  BUN 6 - 20 mg/dL 7   Creatinine, Ser 0.44 - 1.00 mg/dL 0.94   Calcium 8.9 - 10.3 mg/dL 8.8Low   Total Protein 6.5 - 8.1 g/dL 6.5   Albumin 3.5 - 5.0 g/dL 2.8Low   AST 15 - 41 U/L 31   ALT 0 - 44 U/L 23   Alkaline Phosphatase 38 - 126 U/L 100   Total Bilirubin 0.3 - 1.2 mg/dL 0.6   GFR, Estimated >60 mL/min >60   Comment: (NOTE)  Calculated using the CKD-EPI Creatinine Equation (2021)   Anion gap 5 - 15 11    D-Dimer, Quant 0.00 - 0.50 ug/mL-FEU 3.70High    Imaging:  Scheduled for tomorrow at 11am  MAU Course: Orders Placed This Encounter  Procedures  . CBC  . Comprehensive metabolic panel  . D-dimer, quantitative (not at [facility])  . Discharge patient   Meds ordered this encounter  Medications  . DISCONTD: hydrALAZINE (APRESOLINE) injection 5 mg  . DISCONTD: hydrALAZINE (APRESOLINE) injection 10 mg  . DISCONTD: labetalol (NORMODYNE) injection 20 mg  . DISCONTD: labetalol (NORMODYNE) injection 40 mg  . enoxaparin (LOVENOX) injection 70 mg  . sertraline (ZOLOFT) tablet 25 mg  . labetalol (NORMODYNE) tablet 400 mg  . DISCONTD: labetalol  (NORMODYNE) 200 MG tablet    Sig: Take 2 tablets (400 mg total) by mouth 3 (three) times daily.    Dispense:  90 tablet    Refill:  0    Order Specific Question:   Supervising Provider    Answer:   Merrily Pew   MDM: Preeclampsia labs normal, AST/ALT elevated from discharge but still WNL  BP severe range x2, IV hydralazine given which brought BP down to elevated but not severe. Pt had not taken evening dose of labetalol or zoloft so both given and BP down to 146/73  Presentation and results discussed with Dr. Roselie Awkward who agreed pt needs increase in labetalol back to pregnancy dose, prophylactic treatment for possible DVT with imaging in the morning.  Lovenox given, venous U/S scheduled for tomorrow morning and message left on dept u/s. Pt amenable to plan, all questions by patient and MGM answered at bedside.  Assessment: 1. Postpartum care and examination   2. Chronic hypertension   3. Pelvic pain in female   4. Cough in adult   5. Chronic hypertension affecting pregnancy    Plan: Discharge home in stable condition with preeclampsia precautions.   Follow-up Information    [facility]. Go in 1 week(s).   Specialty: Obstetrics and Gynecology Why: for BP check Contact information: [address] [place]. Go on 05/01/2020.   Why: Go to [facility] at 11am tomorrow morning and tell them you are there for a vascular study. Contact information: [place] [address] [phone]              Allergies as of 04/30/2020   No Known Allergies     Medication List    STOP taking these medications   labetalol 200 MG tablet Commonly known as: NORMODYNE     TAKE these medications   Accu-Chek Guide Me w/Device Kit 1 each by Does not apply route 4 (four) times daily.    Accu-Chek Guide test strip Generic drug: glucose blood Use as instructed to check blood sugar 4 times daily   Accu-Chek Softclix Lancets lancets Use as instructed to check blood sugar 4 times daily   acetaminophen 500 MG tablet Commonly known as: TYLENOL Take 500 mg by mouth every 6 (six) hours as needed.   Blood Pressure Cuff Misc 1 Device by Does not apply route once a week.   butalbital-acetaminophen-caffeine 50-325-40 MG tablet Commonly known as: FIORICET Take 1-2 tablets by mouth every 6 (six) hours as needed for headache.   calcium carbonate 750 MG chewable tablet Commonly known as: TUMS EX Chew 1 tablet by mouth daily.   docusate sodium 100 MG capsule Commonly known as: COLACE Take 1 capsule (100 mg total) by mouth 2 (two) times daily.   ferrous sulfate 325 (65 FE) MG tablet Take 1 tablet (325 mg total) by mouth every other day.   ibuprofen 600 MG tablet Commonly known as: ADVIL Take 1 tablet (600 mg total) by mouth every 6 (six) hours as needed.   PRENATAL VITAMINS PO Take 1 tablet by mouth daily.   sertraline 25 MG tablet Commonly known as: ZOLOFT Take 1 tablet (25 mg total) by mouth daily.      Gaylan Gerold, CNM, MSN, [place] Certified Nurse Midwife, [facility]

## 2020-05-01 ENCOUNTER — Other Ambulatory Visit: Payer: BC Managed Care – PPO

## 2020-05-01 ENCOUNTER — Inpatient Hospital Stay (HOSPITAL_COMMUNITY)
Admission: AD | Admit: 2020-05-01 | Discharge: 2020-05-01 | Disposition: A | Discharge status: Home / Self Care | Payer: BC Managed Care – PPO | Attending: Family Medicine | Admitting: Family Medicine

## 2020-05-01 ENCOUNTER — Other Ambulatory Visit: Payer: Self-pay

## 2020-05-01 ENCOUNTER — Other Ambulatory Visit: Payer: Self-pay | Admitting: Family Medicine

## 2020-05-01 ENCOUNTER — Ambulatory Visit (HOSPITAL_BASED_OUTPATIENT_CLINIC_OR_DEPARTMENT_OTHER)
Admission: RE | Admit: 2020-05-01 | Discharge: 2020-05-01 | Disposition: A | Discharge status: Home / Self Care | Payer: BC Managed Care – PPO | Source: Ambulatory Visit | Attending: Family Medicine | Admitting: Family Medicine

## 2020-05-01 DIAGNOSIS — Z79899 Other long term (current) drug therapy: Secondary | ICD-10-CM | POA: Diagnosis not present

## 2020-05-01 DIAGNOSIS — I82462 Acute embolism and thrombosis of left calf muscular vein: Secondary | ICD-10-CM | POA: Diagnosis present

## 2020-05-01 DIAGNOSIS — O871 Deep phlebothrombosis in the puerperium: Secondary | ICD-10-CM | POA: Insufficient documentation

## 2020-05-01 DIAGNOSIS — R600 Localized edema: Secondary | ICD-10-CM

## 2020-05-01 DIAGNOSIS — O99413 Diseases of the circulatory system complicating pregnancy, third trimester: Secondary | ICD-10-CM | POA: Diagnosis not present

## 2020-05-01 DIAGNOSIS — O9943 Diseases of the circulatory system complicating the puerperium: Secondary | ICD-10-CM | POA: Diagnosis not present

## 2020-05-01 DIAGNOSIS — Z8759 Personal history of other complications of pregnancy, childbirth and the puerperium: Secondary | ICD-10-CM | POA: Insufficient documentation

## 2020-05-01 DIAGNOSIS — Z3A36 36 weeks gestation of pregnancy: Secondary | ICD-10-CM

## 2020-05-01 DIAGNOSIS — O1093 Unspecified pre-existing hypertension complicating the puerperium: Secondary | ICD-10-CM | POA: Diagnosis not present

## 2020-05-01 DIAGNOSIS — O10913 Unspecified pre-existing hypertension complicating pregnancy, third trimester: Secondary | ICD-10-CM

## 2020-05-01 DIAGNOSIS — I82402 Acute embolism and thrombosis of unspecified deep veins of left lower extremity: Secondary | ICD-10-CM | POA: Diagnosis present

## 2020-05-01 DIAGNOSIS — O10919 Unspecified pre-existing hypertension complicating pregnancy, unspecified trimester: Secondary | ICD-10-CM

## 2020-05-01 DIAGNOSIS — O1003 Pre-existing essential hypertension complicating the puerperium: Secondary | ICD-10-CM | POA: Diagnosis not present

## 2020-05-01 DIAGNOSIS — O09293 Supervision of pregnancy with other poor reproductive or obstetric history, third trimester: Secondary | ICD-10-CM

## 2020-05-01 DIAGNOSIS — O99215 Obesity complicating the puerperium: Secondary | ICD-10-CM | POA: Diagnosis not present

## 2020-05-01 DIAGNOSIS — E669 Obesity, unspecified: Secondary | ICD-10-CM | POA: Insufficient documentation

## 2020-05-01 MED ORDER — LABETALOL HCL 200 MG PO TABS
400.0000 mg | ORAL_TABLET | Freq: Two times a day (BID) | ORAL | 0 refills | Status: AC
Start: 2020-05-01 — End: ?

## 2020-05-01 MED ORDER — ENOXAPARIN SODIUM 150 MG/ML ~~LOC~~ SOLN: 1.0000 mg/kg | Freq: Two times a day (BID) | SUBCUTANEOUS | 0 refills | Status: DC

## 2020-05-01 MED ORDER — ENOXAPARIN SODIUM 150 MG/ML ~~LOC~~ SOLN: 135.0000 mg | Freq: Two times a day (BID) | SUBCUTANEOUS | 2 refills | Status: AC

## 2020-05-01 MED ORDER — NIFEDIPINE ER OSMOTIC RELEASE 60 MG PO TB24: 60.0000 mg | ORAL_TABLET | Freq: Every day | ORAL | 5 refills | Status: AC

## 2020-05-01 MED FILL — ENOXAPARIN SODIUM 150 MG/ML: 150 | 30 days supply | Qty: 60 | Fill #0

## 2020-05-01 NOTE — Progress Notes (Signed)
Duplex orders

## 2020-05-01 NOTE — MAU Note (Signed)
Pt sent from vascular Lab with Positive DVT. Seen in Triage by Dr. Crissie Reese. Lovenox  Ordered and Pharmacy consult ordered.

## 2020-05-01 NOTE — Progress Notes (Signed)
Lower extremity venous bilateral study completed.  Preliminary results relayed to Crissie Reese, MD.   See CV Proc for preliminary results report.   Jean Rosenthal, RDMS

## 2020-05-01 NOTE — MAU Provider Note (Signed)
History     [id number]  Arrival date and time: 05/01/20 1223    Chief Complaint  Patient presents with  . dvt     HPI Jackie Rojas is a 35 y.o. s/p NSVD on 04/23/2020, with hx of GDMA2, obesity (BMI 50), cHTN on labetalol, who presents for results of lower extremity vascular ultrasound.  Review of discharge summary from last admission on 04/25/2020: Patient admitted for IOL for cHTN with superimposed PreE w SF, she had an uncomplicated delivery and BP's improved to where she could be downtitrated to labetalol 473m TID>BID.   Review of MAU note from last night: Patient presented to MAU last night reporting high blood pressures and swelling in her legs.  She has a family history of blood clots.  Due to being late at night she was scheduled for a vascular ultrasound this morning and discharged home in stable condition.  Her labetalol was also increased from 400 twice daily to 3 times daily again.  Today patient presents after being seen in the vascular lab for a lower extremity duplex, this did indeed show a left lower extremity DVT.  She currently denies any chest pain or shortness of breath, headache, vision changes, right upper quadrant pain.  She does endorse ongoing lower extremity edema.  She has normal postpartum bleeding less than a period.    --/--/O POS (01/18 2000)  OB History    Gravida  5   Para  4   Term  4   Preterm      AB  1   Living  4     SAB  1   IAB      Ectopic      Multiple  0   Live Births  3           Past Medical History:  Diagnosis Date  . Complication of anesthesia   . Gallstones   . Gestational diabetes   . Gestational hypertension    PP  . HSV-2 infection     Past Surgical History:  Procedure Laterality Date  . CHOLECYSTECTOMY  02/2019  . TONSILLECTOMY      Family History  Problem Relation Age of Onset  . Diabetes Maternal Aunt   . Hypertension Maternal Aunt   . Hypertension Maternal Uncle   . Diabetes  Paternal Aunt   . Hypertension Paternal Uncle   . Diabetes Paternal Uncle   . Gout Maternal Grandmother   . Liver disease Maternal Grandmother   . Cancer Paternal Grandmother        bone    Social History   Socioeconomic History  . Marital status: Single    Spouse name: Not on file  . Number of children: Not on file  . Years of education: Not on file  . Highest education level: Not on file  Occupational History  . Not on file  Tobacco Use  . Smoking status: Never Smoker  . Smokeless tobacco: Never Used  Vaping Use  . Vaping Use: Never used  Substance and Sexual Activity  . Alcohol use: Not Currently  . Drug use: Never  . Sexual activity: Yes    Birth control/protection: None  Other Topics Concern  . Not on file  Social History Narrative  . Not on file   Social Determinants of Health   Financial Resource Strain: Low Risk   . Difficulty of Paying Living Expenses: Not very hard  Food Insecurity: No Food Insecurity  . Worried About [employer]  Food in the Last Year: Never true  . Ran Out of Food in the Last Year: Never true  Transportation Needs: No Transportation Needs  . Lack of Transportation (Medical): No  . Lack of Transportation (Non-Medical): No  Physical Activity: Insufficiently Active  . Days of Exercise per Week: 1 day  . Minutes of Exercise per Session: 10 min  Stress: Stress Concern Present  . Feeling of Stress : Very much  Social Connections: Moderately Integrated  . Frequency of Communication with Friends and Family: More than three times a week  . Frequency of Social Gatherings with Friends and Family: Once a week  . Attends Religious Services: More than 4 times per year  . Active Member of Clubs or Organizations: Yes  . Attends Archivist Meetings: More than 4 times per year  . Marital Status: Never married  Intimate Partner Violence: Not At Risk  . Fear of Current or Ex-Partner: No  . Emotionally Abused: No  . Physically Abused: No   . Sexually Abused: No    No Known Allergies  No current facility-administered medications on file prior to encounter.   Current Outpatient Medications on File Prior to Encounter  Medication Sig Dispense Refill  . Accu-Chek Softclix Lancets lancets Use as instructed to check blood sugar 4 times daily 100 each 12  . acetaminophen (TYLENOL) 500 MG tablet Take 500 mg by mouth every 6 (six) hours as needed.    . Blood Glucose Monitoring Suppl (ACCU-CHEK GUIDE ME) w/Device KIT 1 each by Does not apply route 4 (four) times daily. 1 kit 0  . Blood Pressure Monitoring (BLOOD PRESSURE CUFF) MISC 1 Device by Does not apply route once a week. 1 each 0  . butalbital-acetaminophen-caffeine (FIORICET) 50-325-40 MG tablet Take 1-2 tablets by mouth every 6 (six) hours as needed for headache. 20 tablet 0  . calcium carbonate (TUMS EX) 750 MG chewable tablet Chew 1 tablet by mouth daily.    Marland Kitchen docusate sodium (COLACE) 100 MG capsule Take 1 capsule (100 mg total) by mouth 2 (two) times daily. 10 capsule 0  . ferrous sulfate 325 (65 FE) MG tablet Take 1 tablet (325 mg total) by mouth every other day. 45 tablet 0  . glucose blood (ACCU-CHEK GUIDE) test strip Use as instructed to check blood sugar 4 times daily 50 each 12  . ibuprofen (ADVIL) 600 MG tablet Take 1 tablet (600 mg total) by mouth every 6 (six) hours as needed. 30 tablet 0  . Prenatal Vit-Fe Fumarate-FA (PRENATAL VITAMINS PO) Take 1 tablet by mouth daily.    . sertraline (ZOLOFT) 25 MG tablet Take 1 tablet (25 mg total) by mouth daily. 30 tablet 11     ROS Pertinent positives and negative per HPI, all others reviewed and negative  Physical Exam   BP (!) 159/93   Pulse 80   Resp 18   Ht 5' 6"  (1.676 m)   Wt (!) 137.9 kg   SpO2 99%   BMI 49.07 kg/m   Patient Vitals for the past 24 hrs:  BP Pulse Resp SpO2 Height Weight  05/01/20 1306 (!) 159/93 80 18 99 % 5' 6"  (1.676 m) (!) 137.9 kg    Physical Exam Vitals reviewed.   Constitutional:      General: She is not in acute distress.    Appearance: She is well-developed and well-nourished. She is not diaphoretic.  Eyes:     General: No scleral icterus. Pulmonary:     Effort: Pulmonary  effort is normal. No respiratory distress.  Abdominal:     General: There is no distension.     Palpations: Abdomen is soft.     Tenderness: There is no abdominal tenderness. There is no guarding or rebound.  Musculoskeletal:     Right lower leg: Edema present.     Left lower leg: Edema present.  Skin:    General: Skin is warm and dry.  Neurological:     Mental Status: She is alert.     Coordination: Coordination normal.  Psychiatric:        Mood and Affect: Mood and affect normal.      Cervical Exam    Bedside Ultrasound Not done  My interpretation: n/a  FHT n/a  Labs Results for orders placed or performed during the hospital encounter of 04/30/20 (from the past 24 hour(s))  CBC     Status: Abnormal   Collection Time: 04/30/20  7:42 PM  Result Value Ref Range   WBC 7.7 4.0 - 10.5 K/uL   RBC 4.33 3.87 - 5.11 MIL/uL   Hemoglobin 10.4 (L) 12.0 - 15.0 g/dL   HCT 34.0 (L) 36.0 - 46.0 %   MCV 78.5 (L) 80.0 - 100.0 fL   MCH 24.0 (L) 26.0 - 34.0 pg   MCHC 30.6 30.0 - 36.0 g/dL   RDW 19.8 (H) 11.5 - 15.5 %   Platelets 271 150 - 400 K/uL   nRBC 0.0 0.0 - 0.2 %  Comprehensive metabolic panel     Status: Abnormal   Collection Time: 04/30/20  7:42 PM  Result Value Ref Range   Sodium 140 135 - 145 mmol/L   Potassium 4.0 3.5 - 5.1 mmol/L   Chloride 106 98 - 111 mmol/L   CO2 23 22 - 32 mmol/L   Glucose, Bld 91 70 - 99 mg/dL   BUN 7 6 - 20 mg/dL   Creatinine, Ser 0.94 0.44 - 1.00 mg/dL   Calcium 8.8 (L) 8.9 - 10.3 mg/dL   Total Protein 6.5 6.5 - 8.1 g/dL   Albumin 2.8 (L) 3.5 - 5.0 g/dL   AST 31 15 - 41 U/L   ALT 23 0 - 44 U/L   Alkaline Phosphatase 100 38 - 126 U/L   Total Bilirubin 0.6 0.3 - 1.2 mg/dL   GFR, Estimated >60 >60 mL/min   Anion gap 11 5 -  15  D-dimer, quantitative (not at [facility])     Status: Abnormal   Collection Time: 04/30/20  7:42 PM  Result Value Ref Range   D-Dimer, Quant 3.70 (H) 0.00 - 0.50 ug/mL-FEU    Imaging VAS [place] LOWER EXTREMITY VENOUS (DVT)  Result Date: 05/01/2020  Lower Venous DVT Study Indications: Bilateral swelling LT>RT, post-partum.  Anticoagulation: Lovenox. Comparison Study: No prior studies. Performing Technologist: Darlin Coco RDMS  Examination Guidelines: A complete evaluation includes B-mode imaging, spectral Doppler, color Doppler, and power Doppler as needed of all accessible portions of each vessel. Bilateral testing is considered an integral part of a complete examination. Limited examinations for reoccurring indications may be performed as noted. The reflux portion of the exam is performed with the patient in reverse Trendelenburg.  +---------+---------------+---------+-----------+----------+--------------+ RIGHT    CompressibilityPhasicitySpontaneityPropertiesThrombus Aging +---------+---------------+---------+-----------+----------+--------------+ CFV      Full           Yes      Yes                                 +---------+---------------+---------+-----------+----------+--------------+  SFJ      Full                                                        +---------+---------------+---------+-----------+----------+--------------+ FV Prox  Full                                                        +---------+---------------+---------+-----------+----------+--------------+ FV Mid   Full                                                        +---------+---------------+---------+-----------+----------+--------------+ FV DistalFull                                                        +---------+---------------+---------+-----------+----------+--------------+ PFV      Full                                                         +---------+---------------+---------+-----------+----------+--------------+ POP      Full           Yes      Yes                                 +---------+---------------+---------+-----------+----------+--------------+ PTV      Full                                                        +---------+---------------+---------+-----------+----------+--------------+ PERO     Full                                                        +---------+---------------+---------+-----------+----------+--------------+   +---------+---------------+---------+-----------+----------+-----------------+ LEFT     CompressibilityPhasicitySpontaneityPropertiesThrombus Aging    +---------+---------------+---------+-----------+----------+-----------------+ CFV      Full           Yes      Yes                                    +---------+---------------+---------+-----------+----------+-----------------+ SFJ      Full                                                           +---------+---------------+---------+-----------+----------+-----------------+  FV Prox  Full                                                           +---------+---------------+---------+-----------+----------+-----------------+ FV Mid   Full                                                           +---------+---------------+---------+-----------+----------+-----------------+ FV DistalFull                                                           +---------+---------------+---------+-----------+----------+-----------------+ PFV      Full                                                           +---------+---------------+---------+-----------+----------+-----------------+ POP      Full           Yes      Yes                                    +---------+---------------+---------+-----------+----------+-----------------+ PTV      Full                                                            +---------+---------------+---------+-----------+----------+-----------------+ PERO     Partial                                      Age Indeterminate +---------+---------------+---------+-----------+----------+-----------------+     Summary: RIGHT: - There is no evidence of deep vein thrombosis in the lower extremity.  - No cystic structure found in the popliteal fossa.  LEFT: - Findings consistent with age indeterminate deep vein thrombosis involving the left peroneal veins. - No cystic structure found in the popliteal fossa.  *See table(s) above for measurements and observations. Electronically signed by Servando Snare MD on 05/01/2020 at 1:56:24 PM.    Final     MAU Course  Procedures Lab Orders  No laboratory test(s) ordered today   Meds ordered this encounter  Medications  . DISCONTD: enoxaparin (LOVENOX) 150 MG/ML injection    Sig: Inject 0.96 mLs (144 mg total) into the skin every 12 (twelve) hours.    Dispense:  172.8 mL    Refill:  0  . enoxaparin (LOVENOX) 150 MG/ML injection    Sig: Inject 0.9 mLs (135 mg total) into the skin every 12 (twelve) hours for 30 doses.    Dispense:  27 mL  Refill:  2  . labetalol (NORMODYNE) 200 MG tablet    Sig: Take 2 tablets (400 mg total) by mouth 2 (two) times daily.    Dispense:  90 tablet    Refill:  0  . NIFEdipine (PROCARDIA XL/NIFEDICAL XL) 60 MG 24 hr tablet    Sig: Take 1 tablet (60 mg total) by mouth daily.    Dispense:  30 tablet    Refill:  5   Imaging Orders  No imaging studies ordered today    MDM moderate  Assessment and Plan  #Left lower extremity DVT Patient presenting after ultrasound showing left lower extremity DVT, no symptoms of pulmonary embolism and regardless treatment is the same.  Would not pursue a CTPA at this time.  This is a presumed provoked DVT in the setting of pregnancy, patient has strong family history of thromboembolism but it is unclear if it is provoked or unprovoked.  Pres[facility]  [facility] and I have communicated with them as well, they will come to bring patient her first month of Lovenox as she is currently breast-feeding and they will do teaching with her as well.  Plan for 14-monthcourse of Lovenox, no further anticoagulation needed after that time.  Problem list updated and message sent to clinic for postpartum visit provider to be made aware.  #Chronic hypertension #History of severe preeclampsia Patient with mild range blood pressures while here and asymptomatic.  No concern for postpartum preeclampsia at this time.  Return to labetalol 400 mg BID, start Nifedipine 634mXL.  Discharged home in stable condition.  MaClarnce FlockMD/MPH 05/01/20 2:43 PM  Allergies as of 05/01/2020   No Known Allergies     Medication List    TAKE these medications   Accu-Chek Guide Me w/Device Kit 1 each by Does not apply route 4 (four) times daily.   Accu-Chek Guide test strip Generic drug: glucose blood Use as instructed to check blood sugar 4 times daily   Accu-Chek Softclix Lancets lancets Use as instructed to check blood sugar 4 times daily   acetaminophen 500 MG tablet Commonly known as: TYLENOL Take 500 mg by mouth every 6 (six) hours as needed.   Blood Pressure Cuff Misc 1 Device by Does not apply route once a week.   butalbital-acetaminophen-caffeine 50-325-40 MG tablet Commonly known as: FIORICET Take 1-2 tablets by mouth every 6 (six) hours as needed for headache.   calcium carbonate 750 MG chewable tablet Commonly known as: TUMS EX Chew 1 tablet by mouth daily.   docusate sodium 100 MG capsule Commonly known as: COLACE Take 1 capsule (100 mg total) by mouth 2 (two) times daily.   enoxaparin 150 MG/ML injection Commonly known as: Lovenox Inject 0.9 mLs (135 mg total) into the skin every 12 (twelve) hours for 30 doses.   ferrous sulfate 325 (65 FE) MG tablet Take 1 tablet (325 mg total) by mouth every other  day.   ibuprofen 600 MG tablet Commonly known as: ADVIL Take 1 tablet (600 mg total) by mouth every 6 (six) hours as needed.   labetalol 200 MG tablet Commonly known as: NORMODYNE Take 2 tablets (400 mg total) by mouth 2 (two) times daily. What changed: when to take this   NIFEdipine 60 MG 24 hr tablet Commonly known as: PROCARDIA XL/NIFEDICAL XL Take 1 tablet (60 mg total) by mouth daily.   PRENATAL VITAMINS PO Take 1 tablet by mouth daily.   sertraline 25 MG  tablet Commonly known as: ZOLOFT Take 1 tablet (25 mg total) by mouth daily.

## 2020-05-03 ENCOUNTER — Encounter (HOSPITAL_COMMUNITY): Payer: Self-pay | Admitting: Emergency Medicine

## 2020-05-03 ENCOUNTER — Other Ambulatory Visit: Payer: Self-pay

## 2020-05-03 ENCOUNTER — Emergency Department (HOSPITAL_COMMUNITY)
Admission: EM | Admit: 2020-05-03 | Discharge: 2020-05-03 | Disposition: A | Discharge status: Home / Self Care | Payer: BC Managed Care – PPO | Attending: Emergency Medicine | Admitting: Emergency Medicine

## 2020-05-03 ENCOUNTER — Emergency Department (HOSPITAL_COMMUNITY): Payer: BC Managed Care – PPO

## 2020-05-03 DIAGNOSIS — R519 Headache, unspecified: Secondary | ICD-10-CM | POA: Diagnosis not present

## 2020-05-03 DIAGNOSIS — H53149 Visual discomfort, unspecified: Secondary | ICD-10-CM | POA: Insufficient documentation

## 2020-05-03 MED ORDER — KETOROLAC TROMETHAMINE 30 MG/ML IJ SOLN
15.0000 mg | Freq: Once | INTRAMUSCULAR | Status: AC
  Administered 2020-05-03: 15 mg via INTRAVENOUS
  Filled 2020-05-03: qty 1

## 2020-05-03 MED ORDER — METOCLOPRAMIDE HCL 5 MG/ML IJ SOLN
10.0000 mg | Freq: Once | INTRAMUSCULAR | Status: AC
  Administered 2020-05-03: 10 mg via INTRAVENOUS
  Filled 2020-05-03: qty 2

## 2020-05-03 MED ORDER — SODIUM CHLORIDE 0.9 % IV BOLUS
500.0000 mL | Freq: Once | INTRAVENOUS | Status: AC
  Administered 2020-05-03: 500 mL via INTRAVENOUS

## 2020-05-03 NOTE — Discharge Instructions (Addendum)
Follow-up with your doctor and your OB/GYN.  You may need more work-up if the headache continues.

## 2020-05-03 NOTE — ED Provider Notes (Signed)
[facility] Provider Note   CSN: [id number] Arrival date & time: 05/03/20  1306     History No chief complaint on file.   Jackie Rojas is a 35 y.o. female.  HPI Patient presents with headache. Began this morning at around 8:00. Severe somewhat frontal. History of migraines but states this feels more severe. Some photophobia. Only mild nausea. Some sensitivity to smell. However patient is around a week and a half postpartum. Had severe preeclampsia with the delivery. Also had some hypotension at the time. Had some hypertension after. Also found to have DVT. Started on Lovenox shots. No numbness weakness. No fevers. No trauma. States she has been doing well up until this morning.    Past Medical History:  Diagnosis Date  . Complication of anesthesia   . Gallstones   . Gestational diabetes   . Gestational hypertension    PP  . HSV-2 infection     Patient Active Problem List   Diagnosis Date Noted  . Acute deep vein thrombosis (DVT) of calf muscle vein of left lower extremity ([place]) 05/01/2020  . Indication for care in labor and delivery, antepartum 04/22/2020  . Depression with anxiety 12/18/2019  . Chronic hypertension with superimposed preeclampsia 11/15/2019  . Silent carrier for alpha-thalassemia 11/07/2019  . Migraine syndrome 01/30/2015  . 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 CHOLECYSTECTOMY  02/2019  . TONSILLECTOMY       OB History    Gravida  5   Para  4   Term  4   Preterm      AB  1   Living  4     SAB  1   IAB      Ectopic      Multiple  0   Live Births  3           Family History  Problem Relation Age of Onset  . Diabetes Maternal Aunt   . Hypertension Maternal Aunt   . Hypertension Maternal Uncle   . Diabetes Paternal Aunt   . Hypertension Paternal Uncle   . Diabetes Paternal Uncle   . Gout Maternal Grandmother   . Liver disease Maternal Grandmother    . Cancer Paternal Grandmother        bone    Social History   Tobacco Use  . Smoking status: Never Smoker  . Smokeless tobacco: Never Used  Vaping Use  . Vaping Use: Never used  Substance Use Topics  . Alcohol use: Not Currently  . Drug use: Never    Home Medications Prior to Admission medications   Medication Sig Start Date End Date Taking? Authorizing Provider  Accu-Chek Softclix Lancets lancets Use as instructed to check blood sugar 4 times daily 02/25/20   Roma Schanz, CNM  acetaminophen (TYLENOL) 500 MG tablet Take 500 mg by mouth every 6 (six) hours as needed.    [provider]  Blood Glucose Monitoring Suppl (ACCU-CHEK GUIDE ME) w/Device KIT 1 each by Does not apply route 4 (four) times daily. 02/25/20   Roma Schanz, CNM  Blood Pressure Monitoring (BLOOD PRESSURE CUFF) MISC 1 Device by Does not apply route once a week. 11/15/19   Jonnie Kind, MD  butalbital-acetaminophen-caffeine (FIORICET) [phone] MG tablet Take 1-2 tablets by mouth every 6 (six) hours as needed for headache. 04/07/20 04/07/21  Florian Buff, MD  calcium carbonate (TUMS EX) 750 MG chewable tablet Chew 1  tablet by mouth daily.    [provider]  docusate sodium (COLACE) 100 MG capsule Take 1 capsule (100 mg total) by mouth 2 (two) times daily. 04/26/20   Aletha Halim, MD  enoxaparin (LOVENOX) 150 MG/ML injection Inject 0.9 mLs (135 mg total) into the skin every 12 (twelve) hours for 30 doses. 05/01/20 05/16/20  Clarnce Flock, MD  ferrous sulfate 325 (65 FE) MG tablet Take 1 tablet (325 mg total) by mouth every other day. 02/25/20   Roma Schanz, CNM  glucose blood (ACCU-CHEK GUIDE) test strip Use as instructed to check blood sugar 4 times daily 02/25/20   Roma Schanz, CNM  ibuprofen (ADVIL) 600 MG tablet Take 1 tablet (600 mg total) by mouth every 6 (six) hours as needed. 04/25/20   Aletha Halim, MD  labetalol (NORMODYNE) 200 MG tablet Take 2 tablets  (400 mg total) by mouth 2 (two) times daily. 05/01/20   Clarnce Flock, MD  NIFEdipine (PROCARDIA XL/NIFEDICAL XL) 60 MG 24 hr tablet Take 1 tablet (60 mg total) by mouth daily. 05/01/20   Clarnce Flock, MD  Prenatal Vit-Fe Fumarate-FA (PRENATAL VITAMINS PO) Take 1 tablet by mouth daily.    [provider]  sertraline (ZOLOFT) 25 MG tablet Take 1 tablet (25 mg total) by mouth daily. 12/18/19   Roma Schanz, CNM    Allergies    Patient has no known allergies.  Review of Systems   Review of Systems  Constitutional: Negative for appetite change.  HENT: Negative for congestion.   Eyes: Positive for photophobia. Negative for visual disturbance.  Respiratory: Negative for shortness of breath.   Cardiovascular: Negative for chest pain.  Gastrointestinal: Negative for abdominal pain.  Genitourinary: Negative for frequency.  Musculoskeletal: Negative for back pain.  Neurological: Positive for headaches.  Psychiatric/Behavioral: Negative for confusion.    Physical Exam Updated Vital Signs BP (!) 145/78   Pulse 83   Temp 98 F (36.7 C) (Oral)   Resp 16   Ht 5' 6"  (1.676 m)   Wt (!) 140.2 kg   LMP  (Exact Date)   SpO2 100%   Breastfeeding Yes   BMI 49.87 kg/m   Physical Exam Vitals and nursing note reviewed.  Constitutional:      Appearance: Normal appearance.  HENT:     Head: Atraumatic.     Right Ear: External ear normal.     Left Ear: External ear normal.  Eyes:     Extraocular Movements: Extraocular movements intact.     Pupils: Pupils are equal, round, and reactive to light.  Cardiovascular:     Rate and Rhythm: Regular rhythm.  Pulmonary:     Breath sounds: No wheezing or rhonchi.  Musculoskeletal:        General: No tenderness.     Cervical back: Neck supple.  Skin:    General: Skin is warm.     Capillary Refill: Capillary refill takes less than 2 seconds.  Neurological:     Mental Status: She is alert and oriented to person, place, and  time.  Psychiatric:        Mood and Affect: Mood normal.     ED Results / Procedures / Treatments   Labs (all labs ordered are listed, but only abnormal results are displayed) Labs Reviewed - No data to display  EKG None  Radiology CT Head Wo Contrast  Result Date: 05/03/2020 CLINICAL DATA:  Headache. Recent onset of use of anticoagulation due to DVT. Recently  postpartum. EXAM: CT HEAD WITHOUT CONTRAST TECHNIQUE: Contiguous axial images were obtained from the base of the skull through the vertex without intravenous contrast. COMPARISON:  None. FINDINGS: Brain: The brainstem, cerebellum, cerebral peduncles, thalami, basal ganglia, basilar cisterns, and ventricular system appear within normal limits. No intracranial hemorrhage, mass lesion, or acute CVA. Vascular: Unremarkable Skull: Unremarkable Sinuses/Orbits: Unremarkable Other: No supplemental non-categorized findings. IMPRESSION: 1. No significant abnormality identified. Electronically Signed   By: Van Clines M.D.   On: 05/03/2020 14:03    Procedures Procedures   Medications Ordered in ED Medications  ketorolac (TORADOL) 30 MG/ML injection 15 mg (15 mg Intravenous Given 05/03/20 1430)  metoCLOPramide (REGLAN) injection 10 mg (10 mg Intravenous Given 05/03/20 1430)  sodium chloride 0.9 % bolus 500 mL (0 mLs Intravenous Stopped 05/03/20 1520)    ED Course  I have reviewed the triage vital signs and the nursing notes.  Pertinent labs & imaging results that were available during my care of the patient were reviewed by me and considered in my medical decision making (see chart for details).    MDM Rules/Calculators/A&P                          Patient presents with acute headache. Frontal. Somewhat throbbing. Some photophobia. History of migraines. However is on anticoagulation for DVT and had recent pregnancy. Patient's blood pressure is 130/70 here. Doubt this is the preeclampsia as the cause of the headache. Head CT done  right about 6 hours after the start of the pain and did not show any bleed. Doubt this is subarachnoid hemorrhage or bleed. Feels better after treatment. Discussed with pharmacist and Toradol and Reglan felt safe for patient and breast-feeding baby. Will discharge home with outpatient follow with PCP and OB.  Final Clinical Impression(s) / ED Diagnoses Final diagnoses:  Acute nonintractable headache, unspecified headache type    Rx / DC Orders ED Discharge Orders    None       Davonna Belling, MD 05/03/20 1621

## 2020-05-03 NOTE — ED Triage Notes (Signed)
One weeks 3 days post delivery. New on set headache rating pain 10/10.  Instructed to come to ED by [facility] nurse.  Sensitivity to light and smell.  History of migraine.  Jackie Rojas

## 2020-05-05 ENCOUNTER — Other Ambulatory Visit: Payer: BC Managed Care – PPO

## 2020-05-05 ENCOUNTER — Telehealth (HOSPITAL_COMMUNITY): Payer: Self-pay | Admitting: Pharmacist

## 2020-05-05 ENCOUNTER — Encounter: Payer: BC Managed Care – PPO | Admitting: Obstetrics & Gynecology

## 2020-05-05 NOTE — Telephone Encounter (Signed)
Pharmacy Transitions of Care Follow-up Telephone Call  Date of discharge: 05/01/20  Discharge Diagnosis: DVT, post-partum  How have you been since you were released from the hospital? Pt reports she is doing really well regarding DVT; however, pt has been seen in ED once since the 1/27 admission for migraine.  She reports pain is improved but remains dull.  She will speak to PCP about this and plans to call them after our conversation.   Medication changes made at discharge: Start enoxaparin 135 mg (0.51ml) twice daily for 3 months  Medication changes obtained and verified? Yes    Medication Accessibility:  Home Pharmacy: [facility]   Was the patient provided with refills on discharged medications? Yes   Have all prescriptions been transferred from [facility] to home pharmacy? Yes   Is the patient able to afford medications? Yes . Notable copays: $0 . Eligible patient assistance: N/A    Medication Review:  ENOXAPARIN (LOVENOX) Enoxaparin 150mg /ml - dose of 0.23ml (135mg ) BID initiated on 05/01/20.   - Discussed importance of taking medication around 12 hours apart (pt is setting an alarm for 9am and 9pm)  - Ensured pt is administering medication correctly, she seems very competent and had no complaints or questions about med administration - Emphasized importance of monitoring for signs and symptoms of bleeding (abnormal bruising, prolonged bleeding, nose bleeds, bleeding from gums, discolored urine, black tarry stools)    Follow-up Appointments:  PCP Hospital f/u appt confirmed? Scheduled to see on 05/28/20 @ 1:50 pm   Specialist Hospital f/u appt confirmed? None  If their condition worsens, is the pt aware to call PCP or go to the Emergency Dept.? Yes  Final Patient Assessment: Pt was doing very well.  She reported dosing enoxaparin correctly (squirting out 0.1 ml to make the 0.9 ml dose).  She is setting a reminder alarm to help her remember twice daily dosing.   She will follow-up with PCP soon regarding.

## 2020-05-06 ENCOUNTER — Telehealth (INDEPENDENT_AMBULATORY_CARE_PROVIDER_SITE_OTHER): Payer: BC Managed Care – PPO | Admitting: *Deleted

## 2020-05-06 VITALS — BP 165/101

## 2020-05-06 DIAGNOSIS — O1093 Unspecified pre-existing hypertension complicating the puerperium: Secondary | ICD-10-CM | POA: Diagnosis not present

## 2020-05-06 DIAGNOSIS — Z419 Encounter for procedure for purposes other than remedying health state, unspecified: Secondary | ICD-10-CM | POA: Diagnosis not present

## 2020-05-06 DIAGNOSIS — Z0131 Encounter for examination of blood pressure with abnormal findings: Secondary | ICD-10-CM

## 2020-05-06 DIAGNOSIS — Z0130 Encounter for examination of blood pressure without abnormal findings: Secondary | ICD-10-CM

## 2020-05-06 DIAGNOSIS — O119 Pre-existing hypertension with pre-eclampsia, unspecified trimester: Secondary | ICD-10-CM

## 2020-05-06 NOTE — Progress Notes (Addendum)
     NURSE VISIT- BLOOD PRESSURE CHECK  I connected with Jackie Rojas on 05/06/20 by telephone  and verified that I am speaking with the correct person using two identifiers.   I discussed the limitations of evaluation and management by telemedicine. The patient expressed understanding and agreed to proceed.  Nurse is at the office, and patient is at home.  NURSE VISIT- BLOOD PRESSURE CHECK  SUBJECTIVE:  Jackie Rojas is a 35 y.o. [id number] female here for BP check. She is postpartum, delivery date 04/23/20    HYPERTENSION ROS:  Postpartum:  . Severe headaches that don't go away with tylenol/other medicines: No  . Visual changes (seeing spots/double/blurred vision) No  . Severe pain under right breast breast or in center of upper chest No  . Severe nausea/vomiting No  . Taking medicines as instructed yes . Swelling improving  OBJECTIVE:  BP (!) 173/95 (BP Location: Left Arm, Patient Position: Sitting)   Appearance oriented to person, place, and time.  ASSESSMENT: Postpartum  blood pressure check  PLAN: Discussed with Dr. Despina Hidden   Recommendations: new prescription will be sent   Follow-up: 1 week BP check   Jobe Marker  05/06/2020 11:26 AM   Attestation of Attending Supervision of Nursing Visit Encounter: Evaluation and management procedures were performed by the nursing staff under my supervision and collaboration.  I have reviewed the nurse's note and chart, and I agree with the management and plan.  Rockne Coons MD Attending Physician for the Center for [facility] 05/06/2020 12:40 PM

## 2020-05-08 ENCOUNTER — Other Ambulatory Visit: Payer: BC Managed Care – PPO

## 2020-05-12 ENCOUNTER — Encounter: Payer: BC Managed Care – PPO | Admitting: Advanced Practice Midwife

## 2020-05-12 ENCOUNTER — Other Ambulatory Visit: Payer: BC Managed Care – PPO

## 2020-05-13 ENCOUNTER — Telehealth: Payer: Self-pay

## 2020-05-14 ENCOUNTER — Other Ambulatory Visit: Payer: Self-pay

## 2020-05-14 ENCOUNTER — Ambulatory Visit (INDEPENDENT_AMBULATORY_CARE_PROVIDER_SITE_OTHER): Payer: BC Managed Care – PPO

## 2020-05-14 VITALS — BP 132/88 | HR 92

## 2020-05-14 DIAGNOSIS — Z0130 Encounter for examination of blood pressure without abnormal findings: Secondary | ICD-10-CM

## 2020-05-14 NOTE — Progress Notes (Signed)
   NURSE VISIT- BLOOD PRESSURE CHECK  SUBJECTIVE:  Jackie Rojas is a 35 y.o. [id number] female here for BP check. She is postpartum, delivery date 04/23/20    HYPERTENSION ROS:  Pregnant/postpartum:  . Severe headaches that don't go away with tylenol/other medicines: No  . Visual changes (seeing spots/double/blurred vision) No  . Severe pain under right breast breast or in center of upper chest No  . Severe nausea/vomiting No  . Taking medicines as instructed yes  OBJECTIVE:  BP 132/88 (BP Location: Right Arm, Patient Position: Sitting, Cuff Size: Large)   Pulse 92   Breastfeeding Yes Comment: Bottle feeding to supplement  Appearance alert, well appearing, and in no distress, oriented to person, place, and time and overweight.  ASSESSMENT: Postpartum  blood pressure check  PLAN: Discussed with Dr. Alysia Penna   Recommendations: no changes needed   Follow-up: as scheduled   Ivan Lacher A Mahkayla Preece  05/14/2020 12:06 PM

## 2020-05-14 NOTE — Progress Notes (Signed)
Agree with A & P. 

## 2020-05-15 ENCOUNTER — Other Ambulatory Visit: Payer: BC Managed Care – PPO

## 2020-05-23 ENCOUNTER — Telehealth: Payer: BC Managed Care – PPO | Admitting: Family Medicine

## 2020-05-23 ENCOUNTER — Other Ambulatory Visit: Payer: Self-pay | Admitting: Women's Health

## 2020-05-23 DIAGNOSIS — G43909 Migraine, unspecified, not intractable, without status migrainosus: Secondary | ICD-10-CM | POA: Diagnosis not present

## 2020-05-23 MED ORDER — TOPIRAMATE 25 MG PO TABS: 25.0000 mg | ORAL_TABLET | Freq: Two times a day (BID) | ORAL | 0 refills | Status: AC

## 2020-05-23 NOTE — Patient Instructions (Signed)
Will start a trial of topamax 25 mg twice daily. We discussed the fact that you will "pump and dump" as far as breast feeding and that baby is transitioning to formula. I recommend resting in a dark quite room whenever you get the opportunity. I know that you are a busy mother and those moments are rare, but try to sleep when your baby sleeps.  Thank you for allow [place] to be apart of your care.   Topiramate tablets What is this medicine? TOPIRAMATE (toe PYRE a mate) is used to treat seizures in adults or children with epilepsy. It is also used for the prevention of migraine headaches. This medicine may be used for other purposes; ask your health care provider or pharmacist if you have questions. COMMON BRAND NAME(S): Topamax, Topiragen What should I tell my health care provider before I take this medicine? They need to know if you have any of these conditions:  bleeding disorder  kidney disease  lung disease  suicidal thoughts, plans, or attempt  an unusual or allergic reaction to topiramate, other medicines, foods, dyes, or preservatives  pregnant or trying to get pregnant  breast-feeding How should I use this medicine? Take this medicine by mouth with a glass of water. Follow the directions on the prescription label. Do not cut, crush or chew this medicine. Swallow the tablets whole. You can take it with or without food. If it upsets your stomach, take it with food. Take your medicine at regular intervals. Do not take it more often than directed. Do not stop taking except on your doctor's advice. A special MedGuide will be given to you by the pharmacist with each prescription and refill. Be sure to read this information carefully each time. Talk to your pediatrician regarding the use of this medicine in children. While this drug may be prescribed for children as young as 48 years of age for selected conditions, precautions do apply. Overdosage: If you think you have taken too much of this  medicine contact a poison control center or emergency room at once. NOTE: This medicine is only for you. Do not share this medicine with others. What if I miss a dose? If you miss a dose, take it as soon as you can. If your next dose is to be taken in less than 6 hours, then do not take the missed dose. Take the next dose at your regular time. Do not take double or extra doses. What may interact with this medicine? This medicine may interact with the following medications:  acetazolamide  alcohol  antihistamines for allergy, cough, and cold  aspirin and aspirin-like medicines  atropine  birth control pills  certain medicines for anxiety or sleep  certain medicines for bladder problems like oxybutynin, tolterodine  certain medicines for depression like amitriptyline, fluoxetine, sertraline  certain medicines for seizures like carbamazepine, phenobarbital, phenytoin, primidone, valproic acid, zonisamide  certain medicines for stomach problems like dicyclomine, hyoscyamine  certain medicines for travel sickness like scopolamine  certain medicines for Parkinson's disease like benztropine, trihexyphenidyl  certain medicines that treat or prevent blood clots like warfarin, enoxaparin, dalteparin, apixaban, dabigatran, and rivaroxaban  digoxin  general anesthetics like halothane, isoflurane, methoxyflurane, propofol  hydrochlorothiazide  ipratropium  lithium  medicines that relax muscles for surgery  metformin  narcotic medicines for pain  NSAIDs, medicines for pain and inflammation, like ibuprofen or naproxen  phenothiazines like chlorpromazine, mesoridazine, prochlorperazine, thioridazine  pioglitazone This list may not describe all possible interactions. Give your health care  provider a list of all the medicines, herbs, non-prescription drugs, or dietary supplements you use. Also tell them if you smoke, drink alcohol, or use illegal drugs. Some items may interact  with your medicine. What should I watch for while using this medicine? Visit your doctor or health care professional for regular checks on your progress. Tell your health care professional if your symptoms do not start to get better or if they get worse. Do not stop taking except on your health care professional's advice. You may develop a severe reaction. Your health care professional will tell you how much medicine to take. Wear a medical ID bracelet or chain. Carry a card that describes your disease and details of your medicine and dosage times. This medicine can reduce the response of your body to heat or cold. Dress warm in cold weather and stay hydrated in hot weather. If possible, avoid extreme temperatures like saunas, hot tubs, very hot or cold showers, or activities that can cause dehydration such as vigorous exercise. Check with your health care professional if you have severe diarrhea, nausea, and vomiting, or if you sweat a lot. The loss of too much body fluid may make it dangerous for you to take this medicine. You may get drowsy or dizzy. Do not drive, use machinery, or do anything that needs mental alertness until you know how this medicine affects you. Do not stand up or sit up quickly, especially if you are an older patient. This reduces the risk of dizzy or fainting spells. Alcohol may interfere with the effect of this medicine. Avoid alcoholic drinks. Tell your health care professional right away if you have any change in your eyesight. Patients and their families should watch out for new or worsening depression or thoughts of suicide. Also watch out for sudden changes in feelings such as feeling anxious, agitated, panicky, irritable, hostile, aggressive, impulsive, severely restless, overly excited and hyperactive, or not being able to sleep. If this happens, especially at the beginning of treatment or after a change in dose, call your healthcare professional. This medicine may cause  serious skin reactions. They can happen weeks to months after starting the medicine. Contact your health care provider right away if you notice fevers or flu-like symptoms with a rash. The rash may be red or purple and then turn into blisters or peeling of the skin. Or, you might notice a red rash with swelling of the face, lips or lymph nodes in your neck or under your arms. Birth control may not work properly while you are taking this medicine. Talk to your health care professional about using an extra method of birth control. Women should inform their health care professional if they wish to become pregnant or think they might be pregnant. There is a potential for serious side effects and harm to an unborn child. Talk to your health care professional for more information. What side effects may I notice from receiving this medicine? Side effects that you should report to your doctor or health care professional as soon as possible:  allergic reactions like skin rash, itching or hives, swelling of the face, lips, or tongue  blood in the urine  changes in vision  confusion  loss of memory  pain in lower back or side  pain when urinating  redness, blistering, peeling or loosening of the skin, including inside the mouth  signs and symptoms of bleeding such as bloody or black, tarry stools; red or dark brown urine; spitting up blood  or brown material that looks like coffee grounds; red spots on the skin; unusual bruising or bleeding from the eyes, gums, or nose  signs and symptoms of increased acid in the body like breathing fast; fast heartbeat; headache; confusion; unusually weak or tired; nausea, vomiting  suicidal thoughts, mood changes  trouble speaking or understanding  unusual sweating  unusually weak or tired Side effects that usually do not require medical attention (report to your doctor or health care professional if they continue or are  bothersome):  dizziness  drowsiness  fever  loss of appetite  nausea, vomiting  pain, tingling, numbness in the hands or feet  stomach pain  tiredness  upset stomach This list may not describe all possible side effects. Call your doctor for medical advice about side effects. You may report side effects to FDA at 1-800-FDA-1088. Where should I keep my medicine? Keep out of the reach of children and pets. Store between 15 and 30 degrees C (59 and 86 degrees F). Protect from moisture. Keep the container tightly closed. Get rid of any unused medicine after the expiration date. To get rid of medicines that are no longer needed or have expired:  Take the medicine to a medicine take-back program. Check with your pharmacy or law enforcement to find a location.  If you cannot return the medicine, check the label or package insert to see if the medicine should be thrown out in the garbage or flushed down the toilet. If you are not sure, ask your health care provider. If it is safe to put it in the trash, empty the medicine out of the container. Mix the medicine with cat litter, dirt, coffee grounds, or other unwanted substance. Seal the mixture in a bag or container. Put it in the trash. NOTE: This sheet is a summary. It may not cover all possible information. If you have questions about this medicine, talk to your doctor, pharmacist, or health care provider.  2021 Elsevier/Gold Standard (2019-10-04 15:41:57)

## 2020-05-23 NOTE — Progress Notes (Signed)
Ms. Jackie Rojas, Jackie Rojas are scheduled for a virtual visit with your provider today.    Just as we do with appointments in the office, we must obtain your consent to participate.  Your consent will be active for this visit and any virtual visit you may have with one of our providers in the next 365 days.    If you have a MyChart account, I can also send a copy of this consent to you electronically.  All virtual visits are billed to your insurance company just like a traditional visit in the office.  As this is a virtual visit, video technology does not allow for your provider to perform a traditional examination.  This may limit your provider's ability to fully assess your condition.  If your provider identifies any concerns that need to be evaluated in person or the need to arrange testing such as labs, EKG, etc, we will make arrangements to do so.    Although advances in technology are sophisticated, we cannot ensure that it will always work on either your end or our end.  If the connection with a video visit is poor, we may have to switch to a telephone visit.  With either a video or telephone visit, we are not always able to ensure that we have a secure connection.   I need to obtain your verbal consent now.   Are you willing to proceed with your visit today?   [name] has provided verbal consent on 05/23/2020 for a virtual visit (video or telephone).    Virtual Visit via Video Note  I connected with Jackie Rojas on 05/23/20 at  4:00 PM EST by a video enabled telemedicine application and verified that I am speaking with the correct person using two identifiers.  Location: Patient: Home Provider: [facility]   I discussed the limitations of evaluation and management by telemedicine and the availability of in person appointments. The patient expressed understanding and agreed to proceed.  History of Present Illness: Jackie Rojas is a 35 year old female  with a medical history significant for migraine headaches presents via video with complaints of an ongoing headache since around 430 this morning.  Patient says that she awakened with a headache characterized as constant, throbbing, and bilateral.  Patient has been taking Tylenol around every 4 hours without any relief.  She says that she has had migraine headaches in the past and has been on Imitrex and Fioricet in the past.  Patient has not had these medications in quite some time due to recent pregnancy.  Patient states that she is both breast-feeding and formula feeding her baby at this time.  She denies any blurred vision, dizziness, abnormal gait, recent falls, injury, nausea, vomiting, or diarrhea.  Patient says that she notified her primary care provider earlier who recommended reporting to urgent care.  Patient was unable to go to urgent care due to childcare constraints.  Headache  This is a chronic problem. The current episode started today. The problem occurs constantly. The problem has been unchanged. The pain is located in the bilateral region. The pain does not radiate. The pain quality is similar to prior headaches. The quality of the pain is described as aching. The pain is at a severity of 7/10. The pain is moderate. Pertinent negatives include no abdominal pain, abnormal behavior, back pain, blurred vision, coughing, dizziness, ear pain, facial sweating, fever, loss of balance, muscle aches, scalp tenderness, seizures, sinus pressure, sore throat, tingling, visual change  or weakness.     Past Medical History:  Diagnosis Date  . Complication of anesthesia   . Gallstones   . Gestational diabetes   . Gestational hypertension    PP  . HSV-2 infection    Social History   Socioeconomic History  . Marital status: Single    Spouse name: Not on file  . Number of children: Not on file  . Years of education: Not on file  . Highest education level: Not on file  Occupational History  .  Not on file  Tobacco Use  . Smoking status: Never Smoker  . Smokeless tobacco: Never Used  Vaping Use  . Vaping Use: Never used  Substance and Sexual Activity  . Alcohol use: Not Currently  . Drug use: Never  . Sexual activity: Yes    Birth control/protection: None  Other Topics Concern  . Not on file  Social History Narrative  . Not on file   Social Determinants of Health   Financial Resource Strain: Low Risk   . Difficulty of Paying Living Expenses: Not very hard  Food Insecurity: No Food Insecurity  . Worried About Programme researcher, broadcasting/film/video in the Last Year: Never true  . Ran Out of Food in the Last Year: Never true  Transportation Needs: No Transportation Needs  . Lack of Transportation (Medical): No  . Lack of Transportation (Non-Medical): No  Physical Activity: Insufficiently Active  . Days of Exercise per Week: 1 day  . Minutes of Exercise per Session: 10 min  Stress: Stress Concern Present  . Feeling of Stress : Very much  Social Connections: Moderately Integrated  . Frequency of Communication with Friends and Family: More than three times a week  . Frequency of Social Gatherings with Friends and Family: Once a week  . Attends Religious Services: More than 4 times per year  . Active Member of Clubs or Organizations: Yes  . Attends Banker Meetings: More than 4 times per year  . Marital Status: Never married  Intimate Partner Violence: Not At Risk  . Fear of Current or Ex-Partner: No  . Emotionally Abused: No  . Physically Abused: No  . Sexually Abused: No   Immunization History  Administered Date(s) Administered  . Influenza,inj,Quad PF,6+ Mos 02/19/2020  . Tdap 02/19/2020   No Known Allergies Review of Systems  Constitutional: Negative for chills and fever.  HENT: Negative for ear pain, sinus pressure and sore throat.   Eyes: Negative for blurred vision.  Respiratory: Negative for cough.   Cardiovascular: Negative.   Gastrointestinal: Negative  for abdominal pain.  Genitourinary: Negative for frequency and hematuria.  Musculoskeletal: Negative for back pain.  Neurological: Positive for headaches. Negative for dizziness, tingling, tremors, focal weakness, seizures, weakness and loss of balance.  Psychiatric/Behavioral: Negative.        Assessment and Plan: Migraine syndrome Patient has attempted Fioricet in the past without any relief.  Also, patient attempted sumatriptan in the past but has not been on this medication for greater than 1 year.  We will start a trial of Topamax 25 mg twice daily.  Also, recommend that patient continues using Tylenol 1000 mg every 6 hours as needed. Recommend resting in a quiet dark room if possible. Follow-up with primary care provider in 1 week for this problem. - topiramate (TOPAMAX) 25 MG tablet; Take 1 tablet (25 mg total) by mouth 2 (two) times daily for 7 days.  Dispense: 14 tablet; Refill: 0  Follow Up Instructions:  I discussed the assessment and treatment plan with the patient. The patient was provided an opportunity to ask questions and all were answered. The patient agreed with the plan and demonstrated an understanding of the instructions.   The patient was advised to call back or seek an in-person evaluation if the symptoms worsen or if the condition fails to improve as anticipated.  I provided 10 minutes of non-face-to-face time during this encounter.  Nolon Nations  APRN, MSN, FNP-C Patient Care [facility] [address] [place], [place] [address] [phone]

## 2020-05-28 ENCOUNTER — Telehealth: Payer: Medicaid Other | Admitting: Advanced Practice Midwife

## 2020-06-02 ENCOUNTER — Ambulatory Visit: Payer: Medicaid Other | Admitting: Obstetrics & Gynecology

## 2020-06-03 DIAGNOSIS — Z419 Encounter for procedure for purposes other than remedying health state, unspecified: Secondary | ICD-10-CM | POA: Diagnosis not present

## 2020-06-05 ENCOUNTER — Other Ambulatory Visit: Payer: Self-pay

## 2020-06-05 ENCOUNTER — Ambulatory Visit (INDEPENDENT_AMBULATORY_CARE_PROVIDER_SITE_OTHER): Payer: BC Managed Care – PPO | Admitting: *Deleted

## 2020-06-05 ENCOUNTER — Telehealth: Payer: Self-pay | Admitting: *Deleted

## 2020-06-05 DIAGNOSIS — Z3042 Encounter for surveillance of injectable contraceptive: Secondary | ICD-10-CM

## 2020-06-05 LAB — POCT URINE PREGNANCY: Preg Test, Ur: NEGATIVE

## 2020-06-05 MED ORDER — MEDROXYPROGESTERONE ACETATE 150 MG/ML IM SUSP
150.0000 mg | Freq: Once | INTRAMUSCULAR | Status: AC
  Administered 2020-06-05: 150 mg via INTRAMUSCULAR

## 2020-06-05 NOTE — Telephone Encounter (Signed)
Pt delivered on 04/23/20. Pt called stating she started her 1st period since delivery on Sunday. Pt is on a blood thinner. Bleeding is very heavy and I advised usually that 1st period after delivery is heavy. Pt is soaking a tampon and pad every hour. She is not on any birth control. She has a prescription for Junel, but she hasn't started it yet. I spoke with Louie Bun, CNM. Pt was advised not to start Junel. Pt don't feel dizzy or lightheaded. Kim advised can come in for Depo or can start progestin only pill. Pt is interested in Depo and call was transferred to [facility] for nurse visit today. Hopefully, that will help with bleeding. Please let us know if she feels dizzy or lightheaded. Pt voiced understanding. JSY

## 2020-06-05 NOTE — Progress Notes (Signed)
   NURSE VISIT- INJECTION  SUBJECTIVE:  Jackie Rojas is a 35 y.o. [id number] female here for a Depo Provera for contraception/period management. She is postpartum.   OBJECTIVE:  LMP 06/02/2020   Breastfeeding Yes   Appears well, in no apparent distress  Injection administered in: Left upper quad. gluteus  Meds ordered this encounter  Medications  . medroxyPROGESTERone (DEPO-PROVERA) injection 150 mg    ASSESSMENT: Postpartum Depo Provera for contraception/period management PLAN: Follow-up: in 11-13 weeks for next Depo   Annamarie Dawley  06/05/2020 11:17 AM

## 2020-06-16 ENCOUNTER — Other Ambulatory Visit (HOSPITAL_COMMUNITY)
Admission: RE | Admit: 2020-06-16 | Discharge: 2020-06-16 | Disposition: A | Discharge status: Home / Self Care | Payer: BC Managed Care – PPO | Source: Ambulatory Visit | Attending: Advanced Practice Midwife | Admitting: Advanced Practice Midwife

## 2020-06-16 ENCOUNTER — Encounter: Payer: Self-pay | Admitting: Women's Health

## 2020-06-16 ENCOUNTER — Ambulatory Visit (INDEPENDENT_AMBULATORY_CARE_PROVIDER_SITE_OTHER): Payer: BC Managed Care – PPO | Admitting: Women's Health

## 2020-06-16 ENCOUNTER — Other Ambulatory Visit: Payer: Self-pay

## 2020-06-16 DIAGNOSIS — I82462 Acute embolism and thrombosis of left calf muscular vein: Secondary | ICD-10-CM

## 2020-06-16 DIAGNOSIS — O99893 Other specified diseases and conditions complicating puerperium: Secondary | ICD-10-CM | POA: Diagnosis not present

## 2020-06-16 DIAGNOSIS — R102 Pelvic and perineal pain: Secondary | ICD-10-CM | POA: Insufficient documentation

## 2020-06-16 DIAGNOSIS — Z8632 Personal history of gestational diabetes: Secondary | ICD-10-CM

## 2020-06-16 DIAGNOSIS — I10 Essential (primary) hypertension: Secondary | ICD-10-CM | POA: Diagnosis not present

## 2020-06-16 DIAGNOSIS — O1410 Severe pre-eclampsia, unspecified trimester: Secondary | ICD-10-CM | POA: Insufficient documentation

## 2020-06-16 DIAGNOSIS — B373 Candidiasis of vulva and vagina: Secondary | ICD-10-CM | POA: Diagnosis not present

## 2020-06-16 MED ORDER — MEDROXYPROGESTERONE ACETATE 150 MG/ML IM SUSP
150.0000 mg | INTRAMUSCULAR | 3 refills | Status: DC
Start: 2020-06-16 — End: 2020-09-24

## 2020-06-16 NOTE — Progress Notes (Signed)
POSTPARTUM VISIT Patient name: Jackie Rojas MRN [id number]  Date of birth: 09/22/1985 Chief Complaint:   Postpartum Care (On depo)  History of Present Illness:   Jackie Rojas is a 35 y.o. [id number] African American female being seen today for a postpartum visit. She is 7 weeks postpartum following a spontaneous vaginal delivery at 37.6 gestational weeks. IOL: Yes, for CHTN w/ SIPE. Anesthesia: epidural.  Laceration: none.  Complications: none. Inpatient contraception: no.   Pregnancy complicated by [facility] w/ SIPE, A2DM. Tobacco use: no. Substance use disorder: no. Last pap smear: 2021 and results were negative per pt report at PCP. Next pap smear due: 2024 Patient's last menstrual period was 06/02/2020.  Postpartum course has been complicated by DVT dx 1wk pp, on Lovenox now x 60mhs. Bleeding no bleeding. Bowel function is normal. Bladder function is normal. Urinary incontinence? No, fecal incontinence? No Patient is sexually active. Has noticed he smells funny, and she is having some cramping. Denies abnormal discharge, itching/odor/irritation.    Desired contraception: Depo-got 1st dose on 3/3. Patient does not want a pregnancy in the near future.  Desired family size is uncertain #of children.   Upstream - 06/16/20 1412      Pregnancy Intention Screening   Does the patient want to become pregnant in the next year? No    Does the patient's partner want to become pregnant in the next year? No    Would the patient like to discuss contraceptive options today? No      Contraception Wrap Up   Current Method Hormonal Injection    End Method Hormonal Injection    Contraception Counseling Provided No          The pregnancy intention screening data noted above was reviewed. Potential methods of contraception were discussed. The patient elected to proceed with Hormonal Injection.   Edinburgh Postpartum Depression Screening: Positive doing well on zoloft, started counseling w/  private therapist. Denies SI/HI/II  Edinburgh Postnatal Depression Scale - 06/16/20 1413      Edinburgh Postnatal Depression Scale:  In the Past 7 Days   I have been able to laugh and see the funny side of things. 1    I have looked forward with enjoyment to things. 1    I have blamed myself unnecessarily when things went wrong. 1    I have been anxious or worried for no good reason. 2    I have felt scared or panicky for no good reason. 1    Things have been getting on top of me. 2    I have been so unhappy that I have had difficulty sleeping. 2    I have felt sad or miserable. 1    I have been so unhappy that I have been crying. 1    The thought of harming myself has occurred to me. 0    Edinburgh Postnatal Depression Scale Total 12          Baby's course has been uncomplicated. Baby is feeding by breast: milk supply inadequate . Infant has a pediatrician/family doctor? Yes.  Pt has material needs met for her and baby: Yes.   Review of Systems:   Pertinent items are noted in HPI Denies Abnormal vaginal discharge w/ itching/odor/irritation, headaches, visual changes, shortness of breath, chest pain, abdominal pain, severe nausea/vomiting, or problems with urination or bowel movements. Pertinent History Reviewed:  Reviewed past medical,surgical, obstetrical and family history.  Reviewed problem list, medications and allergies. [place]  Para Term Preterm AB Living  5 4 4   1 4   SAB IAB Ectopic Multiple Live Births  1     0 3    # Outcome Date GA Lbr Len/2nd Weight Sex Delivery Anes PTL Lv  5 Term 04/23/20 [redacted]w[redacted]d/ 00:11 6 lb 3.1 oz (2.81 kg) F Vag-Spont EPI  LIV  4 Term 12/21/16 356w0d6 lb 4 oz (2.835 kg) F Vag-Spont EPI N   3 Term 09/26/14 3959w0d lb 6 oz (2.892 kg) F Vag-Spont EPI N LIV  2 SAB 2012 [id number]     1 Term 09/25/08 [id number] 7 oz (2.92 kg) F Vag-Spont EPI N LIV   Physical Assessment:   Vitals:   06/16/20 1417  BP: 134/88  Pulse: 97  Weight: 292  lb 8 oz (132.7 kg)  Height: 5' 5"  (1.651 m)  Body mass index is 48.67 kg/m.       Physical Examination:   General appearance: alert, well appearing, and in no distress  Mental status: alert, oriented to person, place, and time  Skin: warm & dry   Cardiovascular: normal heart rate noted   Respiratory: normal respiratory effort, no distress   Breasts: deferred, no complaints   Abdomen: soft, non-tender   Pelvic: VULVA: normal appearing vulva with no masses, tenderness or lesions, VAGINA: normal appearing vagina with normal color and discharge, no lesions, CERVIX: normal appearing cervix without discharge or lesions  Rectal: no hemorrhoids  Extremities: no edema  Chaperone: AmandCelene Squibb    No results found for this or any previous visit (from the past 24 hour(s)).  Assessment & Plan:  1) Postpartum exam 2) 7 wks s/p spontaneous vaginal delivery [facility] 3) breast & bottle feeding 4) Depression screening 5) Contraception> s/p 1st depo on 3/3, rx sent for refills 6) Cramping> CV swab sent 7) DVT dx 1wk pp> continue Lovenox (until 3mth 70mrse completed)  Essential components of care per ACOG recommendations:  1.  Mood and well being:  . If positive depression screen, discussed and plan developed.  . If using tobacco we discussed reduction/cessation and risk of relapse . If current substance abuse, we discussed and referral to local resources was offered.   2. Infant care and feeding:  . If breastfeeding, discussed returning to work, pumping, breastfeeding-associated pain, guidance regarding return to fertility while lactating if not using another method. If needed, patient was provided with a letter to be allowed to pump q 2-3hrs to support lactation in a private location with access to a refrigerator to store breastmilk.   . Recommended that all caregivers be immunized for flu, pertussis and other preventable communicable diseases . If  pt does not have material needs met for her/baby, referred to local resources for help obtaining these.  3. Sexuality, contraception and birth spacing . Provided guidance regarding sexuality, management of dyspareunia, and resumption of intercourse . Discussed avoiding interpregnancy interval <6mths 34m recommended birth spacing of 18 months  4. Sleep and fatigue . Discussed coping options for fatigue and sleep disruption . Encouraged family/partner/community support of 4 hrs of uninterrupted sleep to help with mood and fatigue  5. Physical recovery  . If pt had a C/S, assessed incisional pain and providing guidance on normal vs prolonged recovery . If pt had a laceration, perineal healing and pain reviewed.  . If urinary or fecal incontinence, discussed management and  referred to PT or uro/gyn if indicated  . Patient is safe to resume physical activity. Discussed attainment of healthy weight.  6.  Chronic disease management . Discussed pregnancy complications if any, and their implications for future childbearing and long-term maternal health. . Review recommendations for prevention of recurrent pregnancy complications, such as 17 hydroxyprogesterone caproate to reduce risk for recurrent PTB not applicable, or aspirin to reduce risk of preeclampsia yes. . Pt had GDM: Yes. If yes, 2hr GTT scheduled: yes. . Reviewed medications and non-pregnant dosing including consideration of whether pt is breastfeeding using a reliable resource such as LactMed: yes . Referred for f/u w/ PCP or subspecialist providers as indicated: not applicable  7. Health maintenance . Mammogram at 37yo or earlier if indicated . Pap smears as indicated  Meds:  Meds ordered this encounter  Medications  . medroxyPROGESTERone (DEPO-PROVERA) 150 MG/ML injection    Sig: Inject 1 mL (150 mg total) into the muscle every 3 (three) months.    Dispense:  1 mL    Refill:  3    Order Specific Question:   Supervising Provider     Answer:   Florian Buff [[id number]]    Follow-up: Return for 1-2wks for 2hr sugar test, then as scheduled for next depo.   No orders of the defined types were placed in this encounter.   [place], [facility] 06/16/2020 3:00 PM

## 2020-06-16 NOTE — Patient Instructions (Signed)
You will have your sugar test next visit.  Please do not eat or drink anything after midnight the night before you come, not even water.  You will be here for at least two hours.  Please make an appointment online for the bloodwork at [email or web address] for 8:30am (or as close to this as possible). Make sure you select the [address] service center. The day of the appointment, check in with our office first, then you will go to [facility] to start the sugar test.   

## 2020-06-18 LAB — CERVICOVAGINAL ANCILLARY ONLY
Bacterial Vaginitis (gardnerella): NEGATIVE
Candida Glabrata: NEGATIVE
Candida Vaginitis: POSITIVE — AB
Chlamydia: NEGATIVE
Comment: NEGATIVE
Comment: NEGATIVE
Comment: NEGATIVE
Comment: NEGATIVE
Comment: NEGATIVE
Comment: NORMAL
Neisseria Gonorrhea: NEGATIVE
Trichomonas: NEGATIVE

## 2020-06-19 ENCOUNTER — Other Ambulatory Visit: Payer: Self-pay | Admitting: Women's Health

## 2020-06-19 ENCOUNTER — Other Ambulatory Visit: Payer: Self-pay

## 2020-06-19 DIAGNOSIS — G43909 Migraine, unspecified, not intractable, without status migrainosus: Secondary | ICD-10-CM

## 2020-06-19 MED ORDER — FLUCONAZOLE 150 MG PO TABS: 150.0000 mg | ORAL_TABLET | Freq: Once | ORAL | 0 refills | Status: AC

## 2020-07-03 IMAGING — US US OB < 14 WEEKS - US OB TV
1 series · 15 of 28 positions shown · non-contrast
Comparison: None.

CLINICAL DATA: Abdominal cramping. Patient reportedly pregnant, 6
weeks and 0 days based on her last menstrual period.

EXAM:
OBSTETRIC <14 WK US AND TRANSVAGINAL OB US
TECHNIQUE: Both transabdominal and transvaginal ultrasound examinations were
performed for complete evaluation of the gestation as well as the
maternal uterus, adnexal regions, and pelvic cul-de-sac.
Transvaginal technique was performed to assess early pregnancy.

[Series 1: us ob < 14 weeks - us ob tv · 15 of 32 slices shown]
[im 1/32]
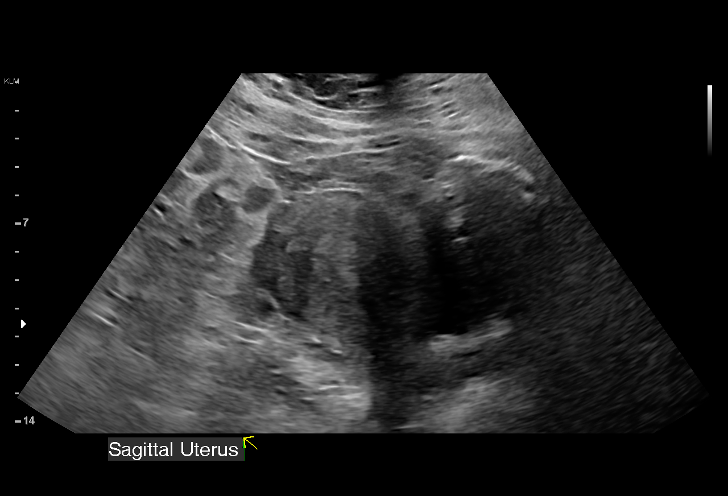
[im 3/32]
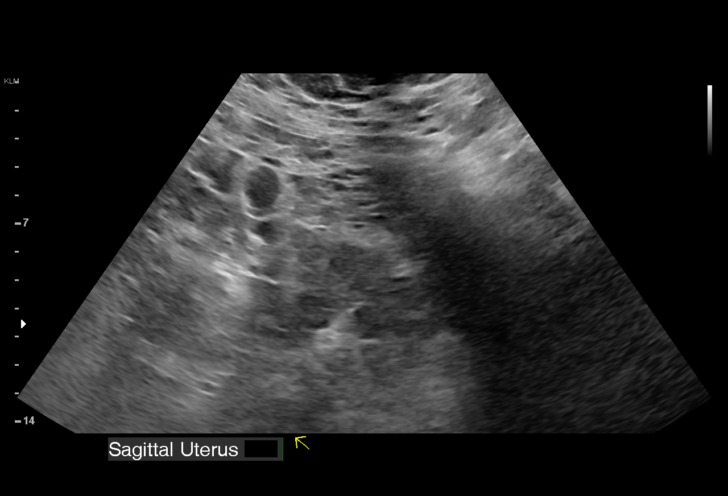
[im 5/32]
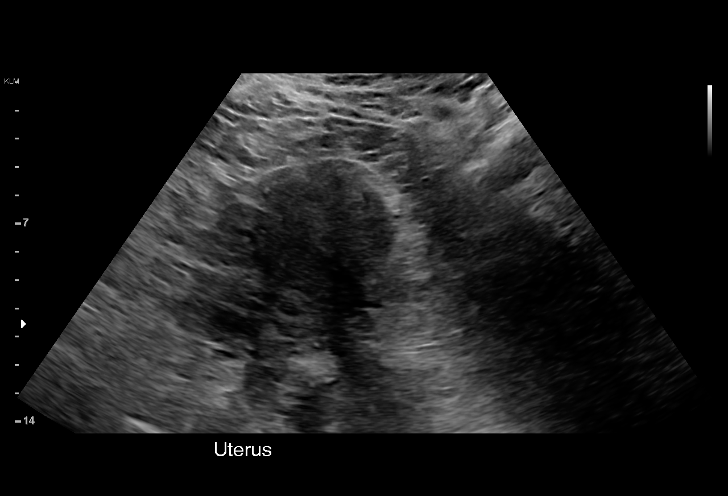
[im 7/32]
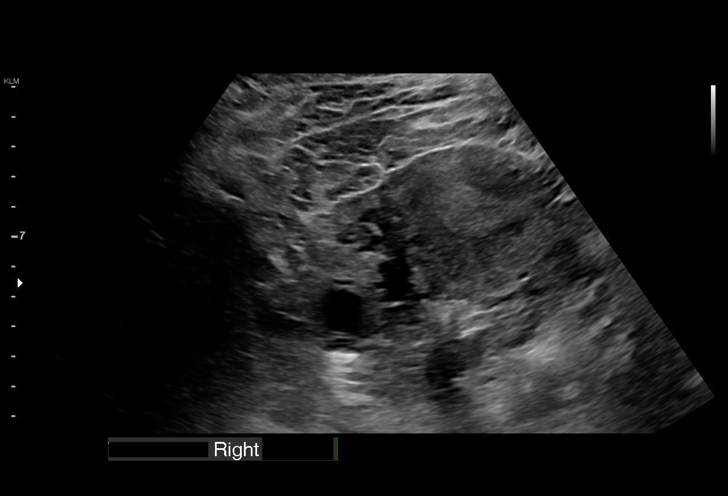
[im 10/32]
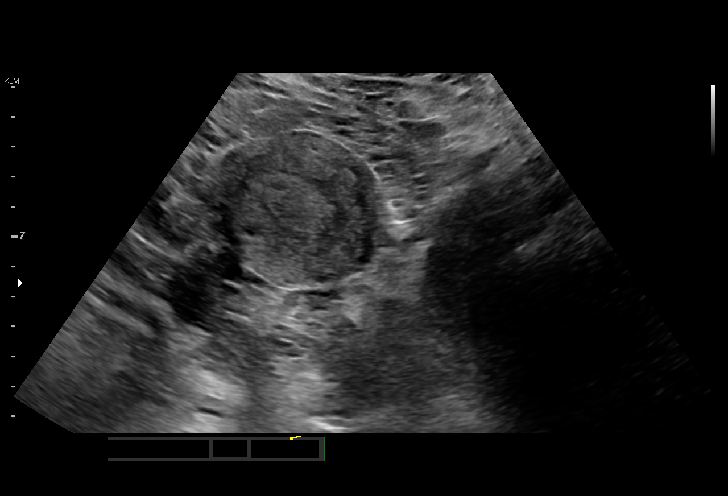
[im 12/32]
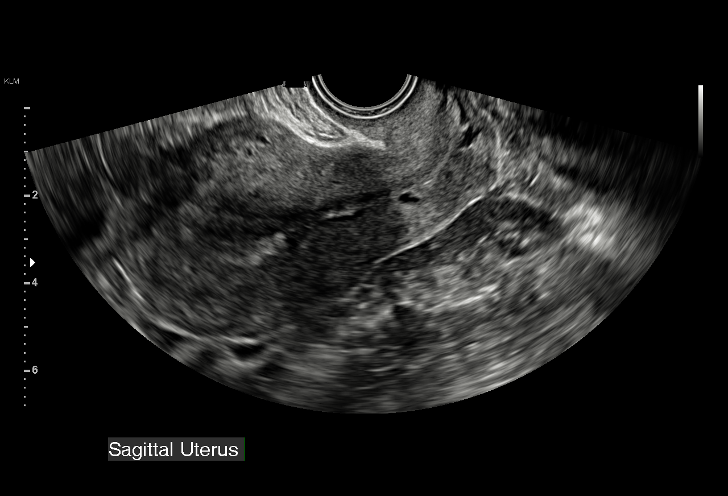
[im 14/32]
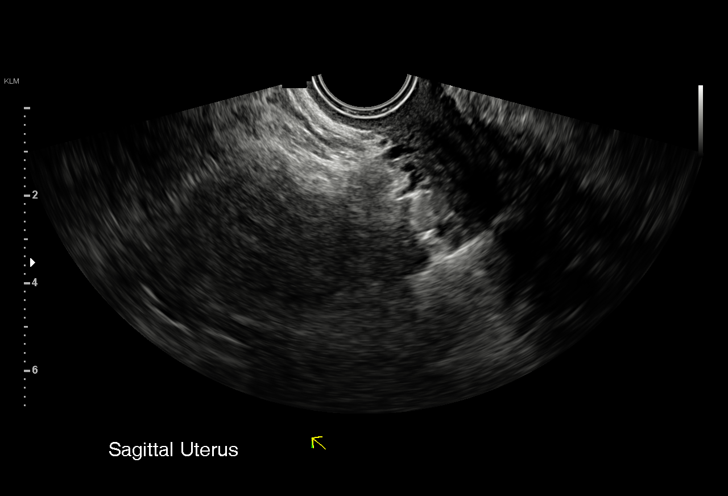
[im 17/32]
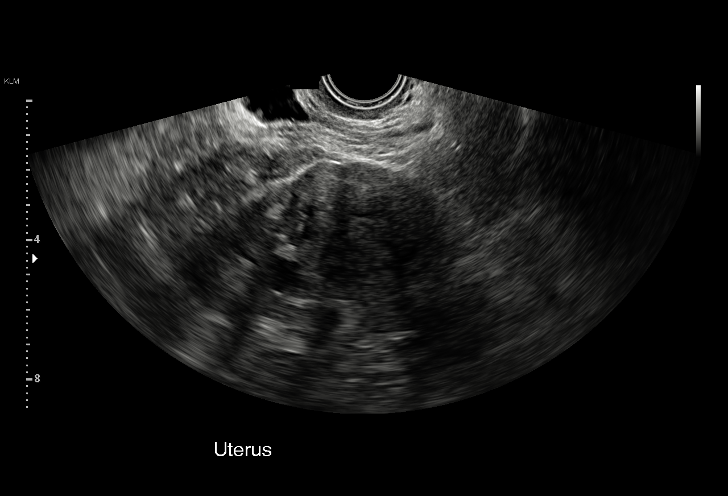
[im 18/32]
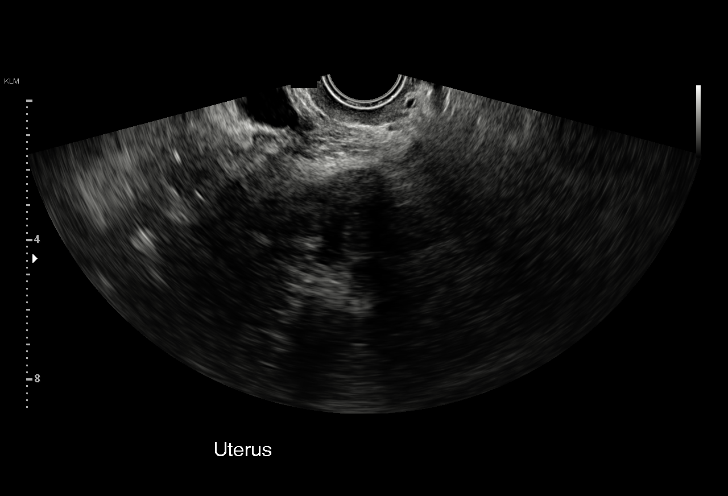
[im 20/32]
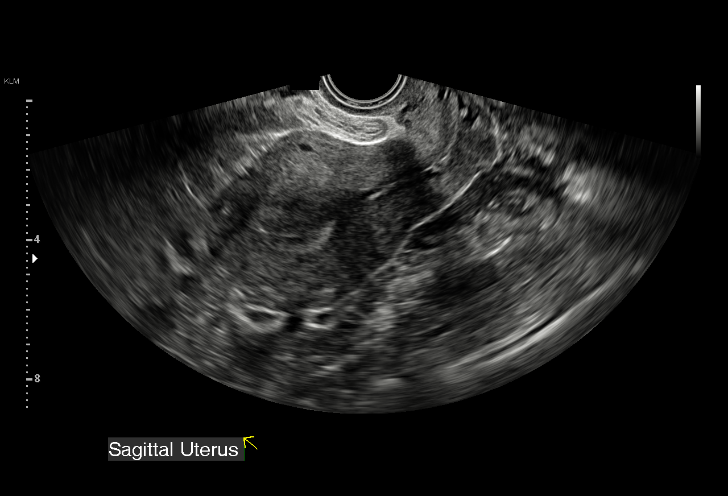
[im 22/32]
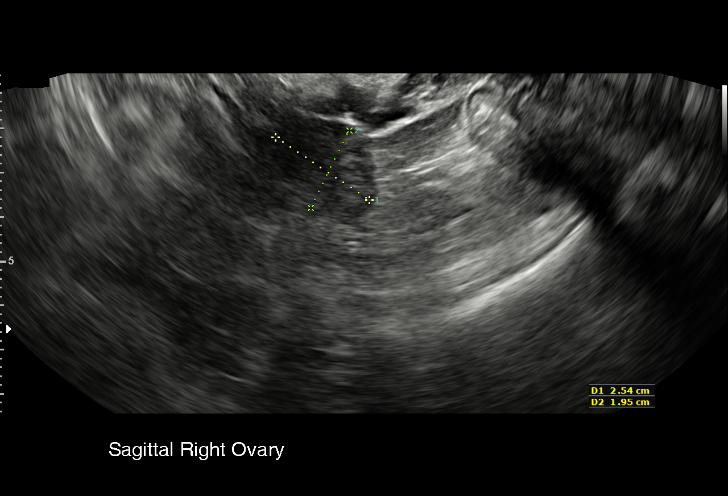
[im 25/32]
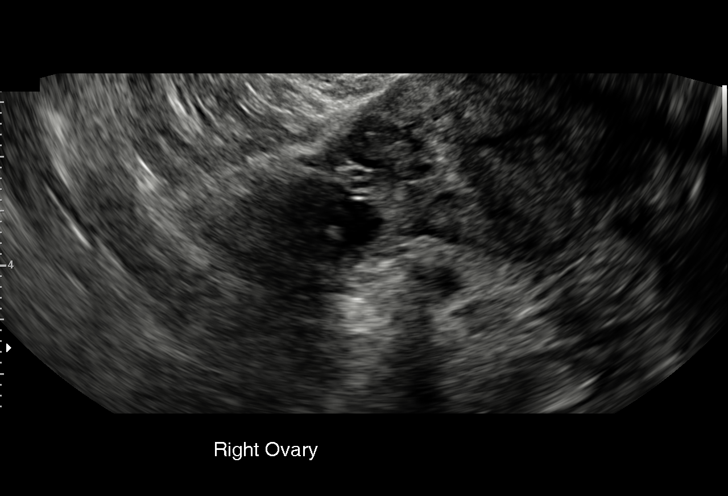
[im 27/32]
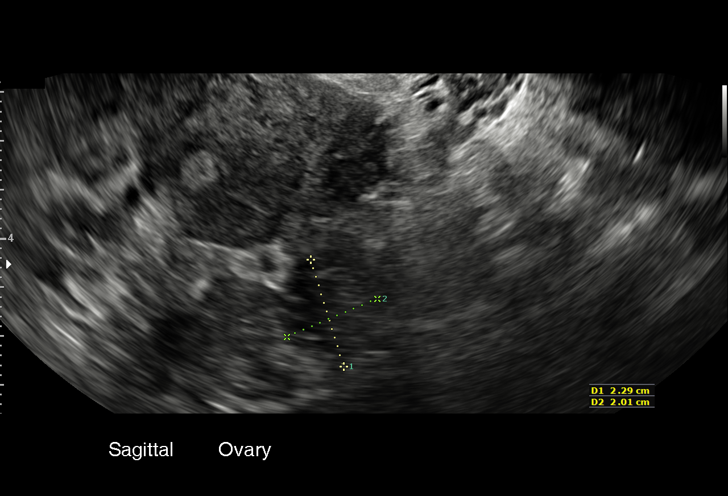
[im 29/32]
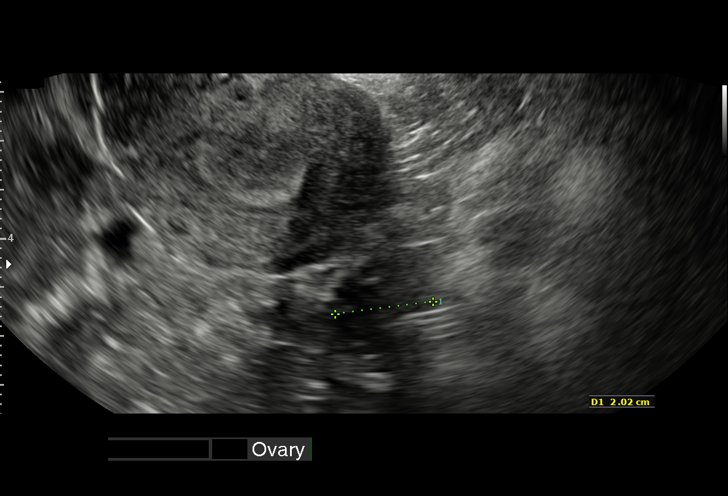
[im 32/32]
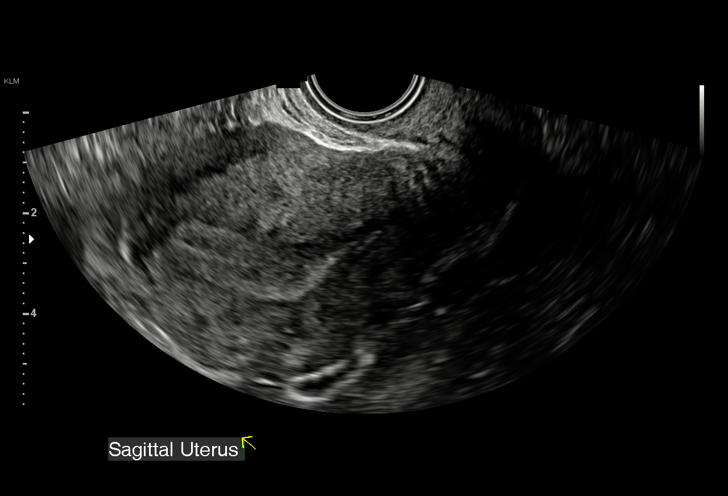

[15 of 28 positions shown; findings below may reference images not displayed]

FINDINGS: Intrauterine gestational sac: None

Yolk sac:  Not Visualized.

Embryo:  Not Visualized.

Cardiac Activity: Not applicable

Maternal uterus/adnexae: No uterine masses. Normal cervix.
Endometrium normal in overall thickness, proximally 9 mm. No
endometrial fluid.

Normal right ovary. Left ovary not well visualized but grossly
normal in size. No adnexal masses. No pelvic free fluid.
IMPRESSION: 1. Negative exam.
2. No evidence of an intrauterine or ectopic pregnancy. Recommend
correlation with the beta HCG level. If this patient is pregnant,
follow-up ultrasound in 10-14 days to document normal progression be
recommended.

## 2020-07-04 DIAGNOSIS — Z419 Encounter for procedure for purposes other than remedying health state, unspecified: Secondary | ICD-10-CM | POA: Diagnosis not present

## 2020-08-03 DIAGNOSIS — Z419 Encounter for procedure for purposes other than remedying health state, unspecified: Secondary | ICD-10-CM | POA: Diagnosis not present

## 2020-08-04 ENCOUNTER — Telehealth: Payer: Self-pay

## 2020-08-04 NOTE — Telephone Encounter (Signed)
2nd attempt to get [name] scheduled with the MM team for a phone visit.I left my name and number for her to return my phone call.

## 2020-08-28 ENCOUNTER — Ambulatory Visit: Payer: BC Managed Care – PPO

## 2020-08-29 ENCOUNTER — Other Ambulatory Visit: Payer: Self-pay | Admitting: Women's Health

## 2020-08-29 DIAGNOSIS — O0992 Supervision of high risk pregnancy, unspecified, second trimester: Secondary | ICD-10-CM

## 2020-09-02 ENCOUNTER — Emergency Department (HOSPITAL_COMMUNITY)
Admission: EM | Admit: 2020-09-02 | Discharge: 2020-09-02 | Disposition: A | Discharge status: Home / Self Care | Payer: BC Managed Care – PPO | Attending: Emergency Medicine | Admitting: Emergency Medicine

## 2020-09-02 ENCOUNTER — Other Ambulatory Visit: Payer: Self-pay

## 2020-09-02 ENCOUNTER — Encounter (HOSPITAL_COMMUNITY): Payer: Self-pay | Admitting: *Deleted

## 2020-09-02 DIAGNOSIS — R519 Headache, unspecified: Secondary | ICD-10-CM | POA: Diagnosis present

## 2020-09-02 DIAGNOSIS — I10 Essential (primary) hypertension: Secondary | ICD-10-CM | POA: Insufficient documentation

## 2020-09-02 MED ORDER — METOCLOPRAMIDE HCL 5 MG/ML IJ SOLN
10.0000 mg | Freq: Once | INTRAMUSCULAR | Status: AC
  Administered 2020-09-02: 10 mg via INTRAMUSCULAR
  Filled 2020-09-02: qty 2

## 2020-09-02 MED ORDER — KETOROLAC TROMETHAMINE 60 MG/2ML IM SOLN
60.0000 mg | Freq: Once | INTRAMUSCULAR | Status: AC
  Administered 2020-09-02: 60 mg via INTRAMUSCULAR
  Filled 2020-09-02: qty 2

## 2020-09-02 MED ORDER — DIPHENHYDRAMINE HCL 25 MG PO CAPS
25.0000 mg | ORAL_CAPSULE | Freq: Once | ORAL | Status: AC
  Administered 2020-09-02: 25 mg via ORAL
  Filled 2020-09-02: qty 1

## 2020-09-02 MED ORDER — DEXAMETHASONE 4 MG PO TABS
10.0000 mg | ORAL_TABLET | Freq: Once | ORAL | Status: AC
  Administered 2020-09-02: 10 mg via ORAL
  Filled 2020-09-02: qty 3

## 2020-09-02 NOTE — ED Triage Notes (Signed)
Pt c/o headache with nausea that started yesterday ; pt has not taking her medications for the last month

## 2020-09-02 NOTE — ED Provider Notes (Signed)
[facility] EMERGENCY DEPARTMENT Provider Note   CSN: [id number] Arrival date & time: 09/02/20  2024     History Chief Complaint  Patient presents with  . Headache    nausea    Jackie Rojas is a 35 y.o. female.   Headache Pain location:  Generalized Quality:  Dull Radiates to:  Does not radiate Severity currently:  8/10 Severity at highest:  8/10 Onset quality:  Gradual Duration:  2 days Timing:  Constant Progression:  Worsening Chronicity:  New Similar to prior headaches: yes   Relieved by:  Nothing Worsened by:  Nothing Associated symptoms: no abdominal pain and no back pain        Past Medical History:  Diagnosis Date  . Complication of anesthesia   . Gallstones   . Gestational diabetes   . Gestational hypertension    PP  . HSV-2 infection     Patient Active Problem List   Diagnosis Date Noted  . H/O severe pre-eclampsia 06/16/2020  . Acute deep vein thrombosis (DVT) of calf muscle vein of left lower extremity ([place]) 05/01/2020  . History of gestational diabetes 02/25/2020  . Depression with anxiety 12/18/2019  . Chronic hypertension 11/15/2019  . Silent carrier for alpha-thalassemia 11/07/2019  . Migraine syndrome 01/30/2015  . 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 CHOLECYSTECTOMY  02/2019  . TONSILLECTOMY       OB History    Gravida  5   Para  4   Term  4   Preterm      AB  1   Living  4     SAB  1   IAB      Ectopic      Multiple  0   Live Births  3           Family History  Problem Relation Age of Onset  . Diabetes Maternal Aunt   . Hypertension Maternal Aunt   . Hypertension Maternal Uncle   . Diabetes Paternal Aunt   . Hypertension Paternal Uncle   . Diabetes Paternal Uncle   . Gout Maternal Grandmother   . Liver disease Maternal Grandmother   . Cancer Paternal Grandmother        bone    Social History   Tobacco Use  . Smoking status: Never Smoker   . Smokeless tobacco: Never Used  Vaping Use  . Vaping Use: Never used  Substance Use Topics  . Alcohol use: Not Currently  . Drug use: Never    Home Medications Prior to Admission medications   Medication Sig Start Date End Date Taking? Authorizing Provider  Accu-Chek Softclix Lancets lancets Use as instructed to check blood sugar 4 times daily Patient not taking: Reported on 06/16/2020 02/25/20   Roma Schanz, CNM  acetaminophen (TYLENOL) 500 MG tablet Take 500 mg by mouth every 6 (six) hours as needed. Patient not taking: Reported on 06/16/2020    [provider]  Blood Glucose Monitoring Suppl (ACCU-CHEK GUIDE ME) w/Device KIT 1 each by Does not apply route 4 (four) times daily. Patient not taking: Reported on 06/16/2020 02/25/20   Roma Schanz, CNM  Blood Pressure Monitoring (BLOOD PRESSURE CUFF) MISC 1 Device by Does not apply route once a week. Patient not taking: Reported on 06/16/2020 11/15/19   Jonnie Kind, MD  calcium carbonate (TUMS EX) 750 MG chewable tablet Chew 1 tablet by mouth daily. Patient not taking: Reported on 06/16/2020  [provider]  docusate sodium (COLACE) 100 MG capsule Take 1 capsule (100 mg total) by mouth 2 (two) times daily. Patient not taking: Reported on 06/16/2020 04/26/20   Aletha Halim, MD  enoxaparin (LOVENOX) 150 MG/ML injection Inject 0.9 mLs (135 mg total) into the skin every 12 (twelve) hours for 30 doses. 05/01/20 05/16/20  Clarnce Flock, MD  ferrous sulfate 325 (65 FE) MG tablet TAKE 1 TABLET BY MOUTH EVERY OTHER DAY Patient not taking: Reported on 06/16/2020 05/23/20   Roma Schanz, CNM  glucose blood (ACCU-CHEK GUIDE) test strip Use as instructed to check blood sugar 4 times daily Patient not taking: Reported on 06/16/2020 02/25/20   Roma Schanz, CNM  ibuprofen (ADVIL) 600 MG tablet Take 1 tablet (600 mg total) by mouth every 6 (six) hours as needed. Patient not taking: Reported on 06/16/2020  04/25/20   Aletha Halim, MD  labetalol (NORMODYNE) 200 MG tablet Take 2 tablets (400 mg total) by mouth 2 (two) times daily. Patient not taking: No sig reported 05/01/20   Clarnce Flock, MD  medroxyPROGESTERone (DEPO-PROVERA) 150 MG/ML injection Inject 1 mL (150 mg total) into the muscle every 3 (three) months. 06/16/20   Roma Schanz, CNM  NIFEdipine (PROCARDIA XL/NIFEDICAL XL) 60 MG 24 hr tablet Take 1 tablet (60 mg total) by mouth daily. 05/01/20   Clarnce Flock, MD  Prenatal Vit-Fe Fumarate-FA (PRENATAL VITAMINS PO) Take 1 tablet by mouth daily. Patient not taking: Reported on 06/16/2020    [provider]  sertraline (ZOLOFT) 25 MG tablet TAKE 1 TABLET BY MOUTH EVERY DAY 09/02/20   Roma Schanz, CNM  topiramate (TOPAMAX) 25 MG tablet Take 1 tablet (25 mg total) by mouth 2 (two) times daily for 7 days. 05/23/20 05/30/20  Dorena Dew, FNP    Allergies    Patient has no known allergies.  Review of Systems   Review of Systems  Gastrointestinal: Negative for abdominal pain.  Musculoskeletal: Negative for back pain.  Neurological: Positive for headaches.  All other systems reviewed and are negative.   Physical Exam Updated Vital Signs BP (!) 144/88   Pulse 69   Temp 98.1 F (36.7 C) (Oral)   Resp 17   Ht _0  (1.676 m)   Wt 127 kg   SpO2 100%   BMI 45.19 kg/m   Physical Exam Vitals and nursing note reviewed.  Constitutional:      Appearance: She is well-developed.  HENT:     Head: Normocephalic and atraumatic.  Eyes:     Extraocular Movements: Extraocular movements intact.  Cardiovascular:     Rate and Rhythm: Normal rate and regular rhythm.  Pulmonary:     Effort: No respiratory distress.     Breath sounds: No stridor.  Abdominal:     General: There is no distension.  Musculoskeletal:     Cervical back: Normal range of motion.  Skin:    General: Skin is warm and dry.  Neurological:     Mental Status: She is alert and oriented to  person, place, and time. Mental status is at baseline.     GCS: GCS eye subscore is 4. GCS verbal subscore is 5. GCS motor subscore is 6.     Cranial Nerves: No cranial nerve deficit.     Sensory: No sensory deficit.     Deep Tendon Reflexes: Reflexes normal.     ED Results / Procedures / Treatments   Labs (all labs ordered are listed,  but only abnormal results are displayed) Labs Reviewed - No data to display  EKG None  Radiology No results found.  Procedures Procedures   Medications Ordered in ED Medications  metoCLOPramide (REGLAN) injection 10 mg (10 mg Intramuscular Given 09/02/20 2325)  ketorolac (TORADOL) injection 60 mg (60 mg Intramuscular Given 09/02/20 2325)  diphenhydrAMINE (BENADRYL) capsule 25 mg (25 mg Oral Given 09/02/20 2324)  dexamethasone (DECADRON) tablet 10 mg (10 mg Oral Given 09/02/20 2323)    ED Course  I have reviewed the triage vital signs and the nursing notes.  Pertinent labs & imaging results that were available during my care of the patient were reviewed by me and considered in my medical decision making (see chart for details).    MDM Rules/Calculators/A&P                          The patient arrived with signs and symptoms consistent with a migraine headache. The patient has history of migraines. This feels like previous migraines.The patient has a reassuring neuro exam lessening chances of intracranial abnormalities. The patient was given decadron, Reglan, Benadryl with toradol. Plan for d/c to rest, hydrate at home with neuro follow up. The patient remained in no acute distress, hemodynamically stable with reassuring neuro exam. The patient was then discharged from the Emergency Department with no further acute issues. Recommend follow up with neurologist or PCP within the next week.   Final Clinical Impression(s) / ED Diagnoses Final diagnoses:  Acute nonintractable headache, unspecified headache type  Hypertension, unspecified type    Rx  / DC Orders ED Discharge Orders    None       Syona Wroblewski, Corene Cornea, MD 09/03/20 [phone]

## 2020-09-03 ENCOUNTER — Telehealth: Payer: Self-pay | Admitting: *Deleted

## 2020-09-03 DIAGNOSIS — Z419 Encounter for procedure for purposes other than remedying health state, unspecified: Secondary | ICD-10-CM | POA: Diagnosis not present

## 2020-09-03 NOTE — Telephone Encounter (Signed)
Transition Care Management Unsuccessful Follow-up Telephone Call  Date of discharge and from where:  09/02/2020 - [facility]  Attempts:  1st Attempt  Reason for unsuccessful TCM follow-up call:  Left voice message

## 2020-09-04 NOTE — Telephone Encounter (Signed)
Transition Care Management Follow-up Telephone Call  Date of discharge and from where: 09/02/2020 - [facility]  How have you been since you were released from the hospital? "Fine"  Any questions or concerns? No  Items Reviewed:  Did the pt receive and understand the discharge instructions provided? Yes   Medications obtained and verified? Yes   Other? No   Any new allergies since your discharge? No   Dietary orders reviewed? No  Do you have support at home? Yes   Functional Questionnaire: (I = Independent and D = Dependent) ADLs: I  Bathing/Dressing- I  Meal Prep- I  Eating- I  Maintaining continence- I  Transferring/Ambulation- I  Managing Meds- I  Follow up appointments reviewed:   PCP Hospital f/u appt confirmed? No    Specialist Hospital f/u appt confirmed? No    Are transportation arrangements needed? No   If their condition worsens, is the pt aware to call PCP or go to the Emergency Dept.? Yes  Was the patient provided with contact information for the PCP's office or ED? Yes  Was to pt encouraged to call back with questions or concerns? Yes

## 2020-09-11 ENCOUNTER — Telehealth: Payer: BC Managed Care – PPO | Admitting: Orthopedic Surgery

## 2020-09-11 DIAGNOSIS — G43909 Migraine, unspecified, not intractable, without status migrainosus: Secondary | ICD-10-CM

## 2020-09-11 MED ORDER — SUMATRIPTAN SUCCINATE 100 MG PO TABS: 100.0000 mg | ORAL_TABLET | ORAL | 0 refills | Status: AC | PRN

## 2020-09-11 NOTE — Progress Notes (Signed)
Virtual Visit Consent   Jackie Rojas, you are scheduled for a virtual visit with a [facility] provider today.     Just as with appointments in the office, your consent must be obtained to participate.  Your consent will be active for this visit and any virtual visit you may have with one of our providers in the next 365 days.     If you have a MyChart account, a copy of this consent can be sent to you electronically.  All virtual visits are billed to your insurance company just like a traditional visit in the office.    As this is a virtual visit, video technology does not allow for your provider to perform a traditional examination.  This may limit your provider's ability to fully assess your condition.  If your provider identifies any concerns that need to be evaluated in person or the need to arrange testing (such as labs, EKG, etc.), we will make arrangements to do so.     Although advances in technology are sophisticated, we cannot ensure that it will always work on either your end or our end.  If the connection with a video visit is poor, the visit may have to be switched to a telephone visit.  With either a video or telephone visit, we are not always able to ensure that we have a secure connection.     I need to obtain your verbal consent now.   Are you willing to proceed with your visit today? Yes   Jackie Rojas has provided verbal consent on 09/11/2020 for a virtual visit (video or telephone).   Lisette Abu, PA-C   Date: 09/11/2020 5:58 PM   Virtual Visit via Video Note   I, Lisette Abu, connected this patient on 09/11/20 at  6:00 PM EDT by a video-enabled telemedicine application and verified that I am speaking with the correct person using two identifiers.  Location: Patient: Virtual Visit Location Patient: Home Provider: Virtual Visit Location Provider: Home Office   I discussed the limitations of evaluation and management by telemedicine and  the availability of in person appointments. The patient expressed understanding and agreed to proceed.    History of Present Illness: Jackie Rojas is a 35 y.o. who identifies as a female who was assigned female at birth, and is being seen today for Headache.  HPI: Jackie Rojas has been suffering with HA for for greater than 2 weeks. She describes a severe pain that is localized mostly to the frontal and parietal region of her right side. She admits to nausea but denies fevers, chills, sweats, or congestion. This differs from her normal migraines in that it is only one one side of her head. She has been treating with analgesics and darkness with mild relief but nothing long lasting. She had an ED visit 10d ago that again provided some relief but the pain came right back. She used to take Imitrex for her symptoms but had to stop due to pregnancy and being on an anticoagulant but both of those factors do not exist now. 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 (Patient not taking: Reported on 06/16/2020), Disp: , Rfl:    Blood Glucose Monitoring Suppl (ACCU-CHEK GUIDE ME) w/Device KIT, 1 each by Does not apply route 4 (four) times daily. (Patient not taking: Reported on 06/16/2020), Disp: 1 kit,  Rfl: 0   Blood Pressure Monitoring (BLOOD PRESSURE CUFF) MISC, 1 Device by Does not apply route once a week. (Patient not taking: Reported on 06/16/2020), Disp: 1 each, Rfl: 0   calcium carbonate (TUMS EX) 750 MG chewable tablet, Chew 1 tablet by mouth daily. (Patient not taking: Reported on 06/16/2020), Disp: , Rfl:    docusate sodium (COLACE) 100 MG capsule, Take 1 capsule (100 mg total) by mouth 2 (two) times daily. (Patient not taking: Reported on 06/16/2020), Disp: 10 capsule, Rfl: 0   enoxaparin (LOVENOX) 150 MG/ML injection, Inject 0.9 mLs (135 mg total) into the skin every 12 (twelve) hours for 30 doses., Disp: 27 mL, Rfl: 2   ferrous sulfate 325 (65 FE) MG tablet, TAKE 1 TABLET BY MOUTH EVERY OTHER DAY (Patient not taking: Reported on 06/16/2020), Disp: 45 tablet, Rfl: 0   glucose blood (ACCU-CHEK GUIDE) test strip, Use as instructed to check blood sugar 4 times daily (Patient not taking: Reported on 06/16/2020), Disp: 50 each, Rfl: 12   ibuprofen (ADVIL) 600 MG tablet, Take 1 tablet (600 mg total) by mouth every 6 (six) hours as needed. (Patient not taking: Reported on 06/16/2020), Disp: 30 tablet, Rfl: 0   labetalol (NORMODYNE) 200 MG tablet, Take 2 tablets (400 mg total) by mouth 2 (two) times daily. (Patient not taking: No sig reported), Disp: 90 tablet, Rfl: 0   medroxyPROGESTERone (DEPO-PROVERA) 150 MG/ML injection, Inject 1 mL (150 mg total) into the muscle every 3 (three) months., Disp: 1 mL, Rfl: 3   NIFEdipine (PROCARDIA XL/NIFEDICAL XL) 60 MG 24 hr tablet, Take 1 tablet (60 mg total) by mouth daily., Disp: 30 tablet, Rfl: 5   Prenatal Vit-Fe Fumarate-FA (PRENATAL VITAMINS PO), Take 1 tablet by mouth daily. (Patient not taking: Reported on 06/16/2020), Disp: , Rfl:    sertraline (ZOLOFT) 25 MG tablet, TAKE 1 TABLET BY MOUTH EVERY DAY, Disp: 90 tablet, Rfl: 4   topiramate (TOPAMAX) 25 MG tablet, Take 1 tablet (25 mg total) by mouth 2 (two) times daily for 7 days., Disp: 14 tablet, Rfl:  0  Observations/Objective: Patient is well-developed, well-nourished in no acute distress.  Resting comfortably  at home.  Head is normocephalic, atraumatic.  No labored breathing.  Speech is clear and coherent with logical content.  Patient is alert and oriented at baseline.    Assessment and Plan: 1. Migraine syndrome -- I reassured pt that this does not sound like a sinus infection (her mom was worried about that) and is likely her migraine. Will prescribe Imitrex 163m q2h prn HA and she was instructed not to take more than 2/d. If this does not resolve her symptoms she was encouraged to seek f/u, preferably with neurology as suggested by the EDP. I will also write a work excuse for this week.   Follow Up Instructions: I discussed the assessment and treatment plan with the patient. The patient was provided an opportunity to ask  questions and all were answered. The patient agreed with the plan and demonstrated an understanding of the instructions.  A copy of instructions were sent to the patient via MyChart.  The patient was advised to call back or seek an in-person evaluation if the symptoms worsen or if the condition fails to improve as anticipated.  Time:  I spent 15 minutes with the patient via telehealth technology discussing the above problems/concerns.    Lisette Abu, PA-C

## 2020-09-16 ENCOUNTER — Other Ambulatory Visit: Payer: BC Managed Care – PPO

## 2020-09-24 ENCOUNTER — Other Ambulatory Visit (HOSPITAL_COMMUNITY)
Admission: RE | Admit: 2020-09-24 | Discharge: 2020-09-24 | Disposition: A | Discharge status: Home / Self Care | Payer: BC Managed Care – PPO | Source: Ambulatory Visit | Attending: Obstetrics & Gynecology | Admitting: Obstetrics & Gynecology

## 2020-09-24 ENCOUNTER — Ambulatory Visit (INDEPENDENT_AMBULATORY_CARE_PROVIDER_SITE_OTHER): Payer: BC Managed Care – PPO | Admitting: Obstetrics & Gynecology

## 2020-09-24 ENCOUNTER — Other Ambulatory Visit: Payer: Self-pay

## 2020-09-24 ENCOUNTER — Other Ambulatory Visit (INDEPENDENT_AMBULATORY_CARE_PROVIDER_SITE_OTHER): Payer: BC Managed Care – PPO

## 2020-09-24 ENCOUNTER — Encounter: Payer: Self-pay | Admitting: Obstetrics & Gynecology

## 2020-09-24 VITALS — BP 140/94 | HR 90 | Ht 66.0 in | Wt 293.0 lb

## 2020-09-24 DIAGNOSIS — N898 Other specified noninflammatory disorders of vagina: Secondary | ICD-10-CM | POA: Insufficient documentation

## 2020-09-24 DIAGNOSIS — Z30011 Encounter for initial prescription of contraceptive pills: Secondary | ICD-10-CM

## 2020-09-24 DIAGNOSIS — Z3202 Encounter for pregnancy test, result negative: Secondary | ICD-10-CM

## 2020-09-24 DIAGNOSIS — Z86718 Personal history of other venous thrombosis and embolism: Secondary | ICD-10-CM | POA: Diagnosis not present

## 2020-09-24 LAB — POCT URINE PREGNANCY: Preg Test, Ur: NEGATIVE

## 2020-09-24 MED ORDER — NORETHINDRONE 0.35 MG PO TABS: 1.0000 | ORAL_TABLET | Freq: Every day | ORAL | 4 refills | Status: AC

## 2020-09-24 NOTE — Progress Notes (Signed)
   NURSE VISIT- VAGINITIS/STD/POC  SUBJECTIVE:  Jackie Rojas is a 35 y.o. [id number] postpartumfemale here for a vaginal swab for vaginitis screening, STD screen.  She reports the following symptoms: discharge described as white and creamy and odor for 3 days. Denies abnormal vaginal bleeding, significant pelvic pain, fever, or UTI symptoms.  OBJECTIVE:  There were no vitals taken for this visit.  Appears well, in no apparent distress  ASSESSMENT: Vaginal swab for  vaginitis & STD screening  PLAN: Self-collected vaginal probe for Gonorrhea, Chlamydia, Trichomonas, Bacterial Vaginosis, Yeast sent to lab Treatment: to be determined once results are received Follow-up as needed if symptoms persist/worsen, or new symptoms develop  Orlyn Odonoghue A Damiano Stamper  09/24/2020 10:27 AM

## 2020-09-24 NOTE — Progress Notes (Signed)
   GYN VISIT Patient name: Jackie Rojas MRN [id number]  Date of birth: Sep 04, 1985 Chief Complaint:   discuss birth control (Wants low estrogen birth control pill)  History of Present Illness:   Jackie Rojas is a 35 y.o. [id number] female being seen today for contraceptive management  Pt has tried IUD, Nexplanon, ring and Depot.  She reports considerable AUB with most of the long term devices.  Wishes to return to OCPs.  History revewed- notes h/o DVT- previously on blood thinner    She reports that without hormones, menses are moderate to heavy for about a full week.  Denies intermenstrual bleeding.  She reports no other acute complaints  Patient's last menstrual period was 09/18/2020.  Depression screen [facility] 2/9 03/17/2020 12/18/2019 10/26/2019  Decreased Interest 0 3 0  Down, Depressed, Hopeless 0 3 0  PHQ - 2 Score 0 6 0  Altered sleeping - 3 0  Tired, decreased energy - 2 0  Change in appetite - 3 1  Feeling bad or failure about yourself  - 3 0  Trouble concentrating - 3 0  Moving slowly or fidgety/restless - 1 0  Suicidal thoughts - 1 0  PHQ-9 Score - 22 1  Difficult doing work/chores - Very difficult -     Review of Systems:   Pertinent items are noted in HPI Denies fever/chills, dizziness, headaches, visual disturbances, fatigue, shortness of breath, chest pain, abdominal pain, vomiting, bowel movements, urination, or intercourse unless otherwise stated above.  Pertinent History Reviewed:  Reviewed past medical,surgical, social, obstetrical and family history.  Reviewed problem list, medications and allergies. Physical Assessment:   Vitals:   09/24/20 1540  BP: (!) 140/94  Pulse: 90  Weight: 293 lb (132.9 kg)  Height: 5\' 6"  (1.676 m)  Body mass index is 47.29 kg/m.       Physical Examination:   General appearance: alert, well appearing, and in no distress  Psych: mood appropriate, normal affect  Skin: warm & dry   Cardiovascular: normal heart rate  noted  Respiratory: normal respiratory effort, no distress  Abdomen: soft, non-tender   Extremities: no edema   Chaperone: N/A    Assessment & Plan:  1) Contraceptive management -due to h/o DVT, not a candidate for combined OCPs -discussed alternative options including POP or Paragard.  Due to HMB she wishes to do some research on her own regarding Paragard.  Plan to f/u in 2-43mos for annual and to re-evaluate menses   Orders Placed This Encounter  Procedures   POCT urine pregnancy    Return in about 3 months (around 12/25/2020) for annual.   12/27/2020, DO Attending Obstetrician & Gynecologist, [facility], [facility]

## 2020-09-25 LAB — CERVICOVAGINAL ANCILLARY ONLY

## 2020-10-03 DIAGNOSIS — Z419 Encounter for procedure for purposes other than remedying health state, unspecified: Secondary | ICD-10-CM | POA: Diagnosis not present

## 2020-11-03 DIAGNOSIS — Z419 Encounter for procedure for purposes other than remedying health state, unspecified: Secondary | ICD-10-CM | POA: Diagnosis not present

## 2020-12-04 DIAGNOSIS — Z419 Encounter for procedure for purposes other than remedying health state, unspecified: Secondary | ICD-10-CM | POA: Diagnosis not present

## 2021-01-03 DIAGNOSIS — Z419 Encounter for procedure for purposes other than remedying health state, unspecified: Secondary | ICD-10-CM | POA: Diagnosis not present

## 2021-02-03 DIAGNOSIS — Z419 Encounter for procedure for purposes other than remedying health state, unspecified: Secondary | ICD-10-CM | POA: Diagnosis not present

## 2021-03-05 DIAGNOSIS — Z419 Encounter for procedure for purposes other than remedying health state, unspecified: Secondary | ICD-10-CM | POA: Diagnosis not present

## 2021-03-09 IMAGING — CT CT HEAD W/O CM
3 series · 16 of 47 positions shown, 19 images · non-contrast
Comparison: None.

CLINICAL DATA: Headache. Recent onset of use of anticoagulation due
to DVT. Recently postpartum.

EXAM:
CT HEAD WITHOUT CONTRAST
TECHNIQUE: Contiguous axial images were obtained from the base of the skull
through the vertex without intravenous contrast.

[Series 2: head w o · axial · 0.42mm/px · z∈[+37,+167]mm · 10 of 32 slices shown, 13 images]
[im 3/32  brain]
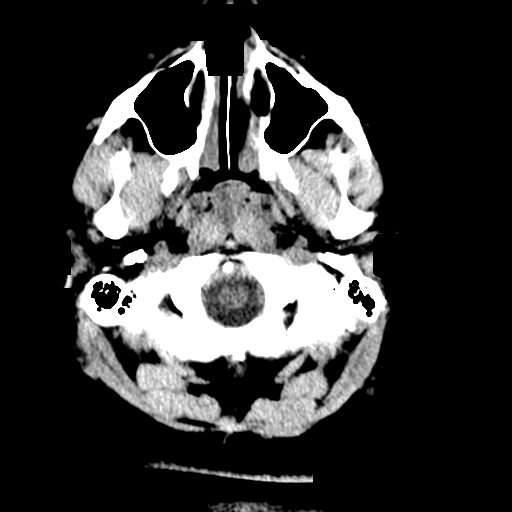
[im 3/32  bone]
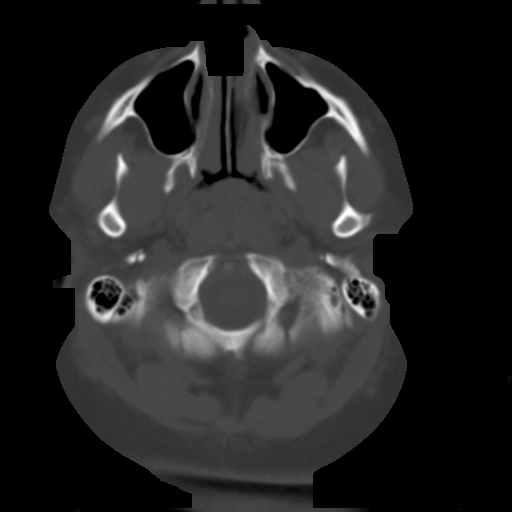
[im 6/32  brain]
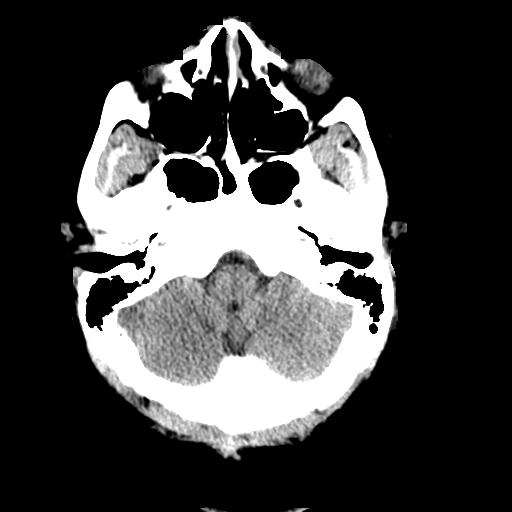
[im 9/32  brain]
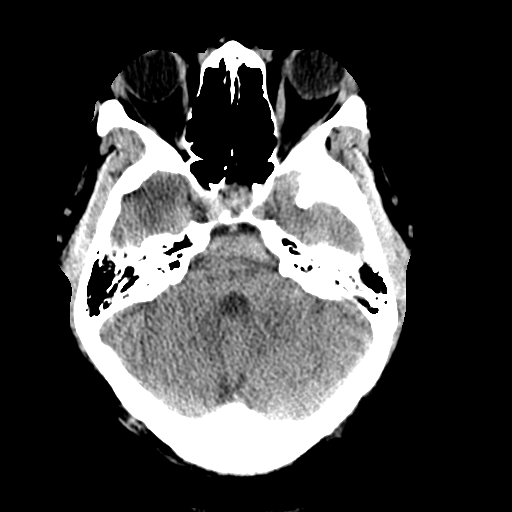
[im 11/32  brain]
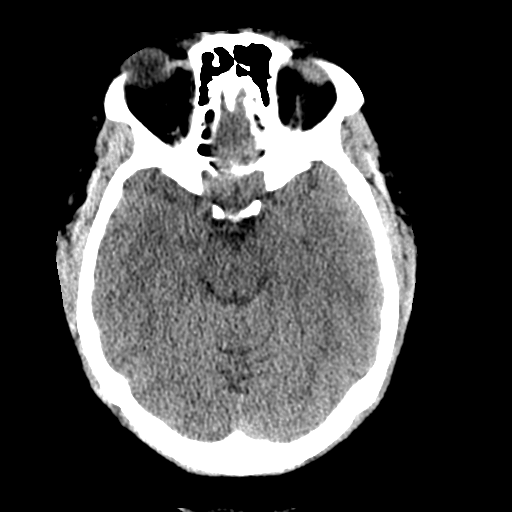
[im 14/32  brain]
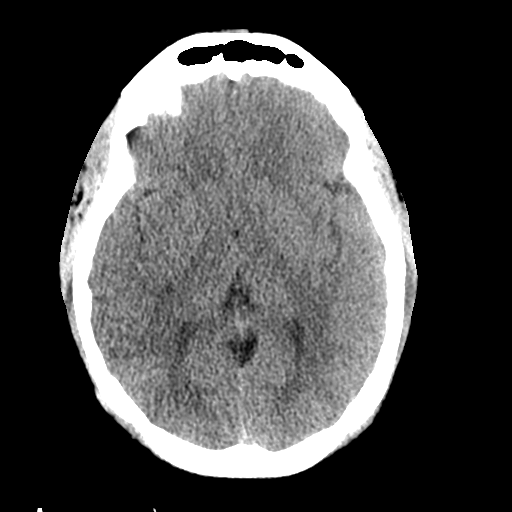
[im 14/32  bone]
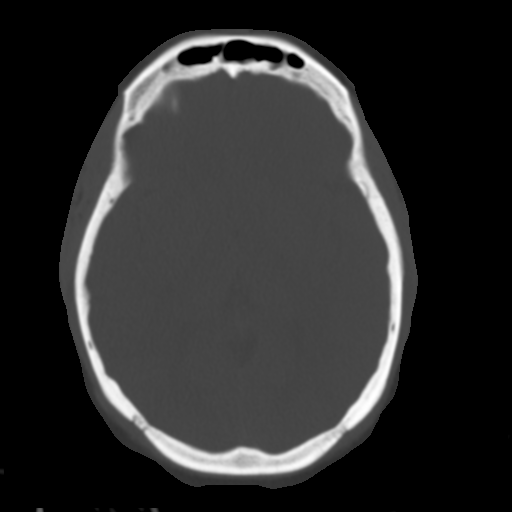
[im 18/32  brain]
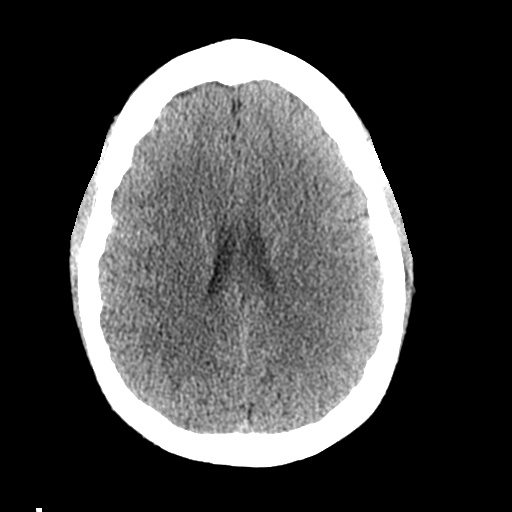
[im 21/32  brain]
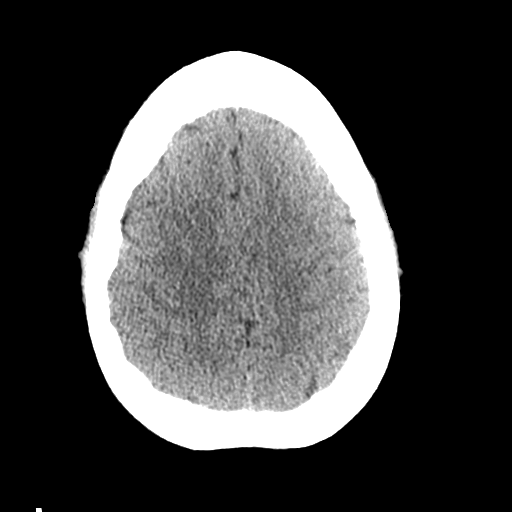
[im 24/32  brain]
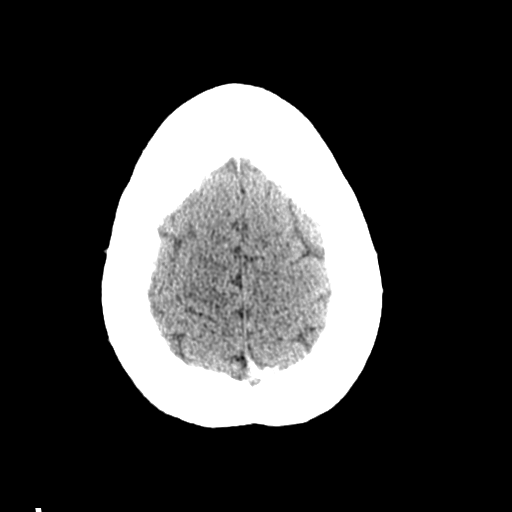
[im 26/32  brain]
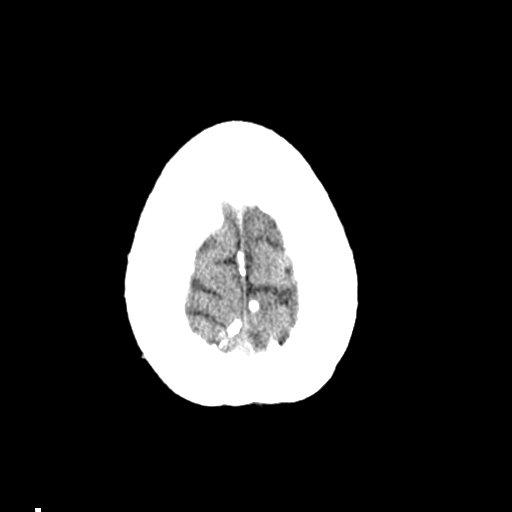
[im 26/32  bone]
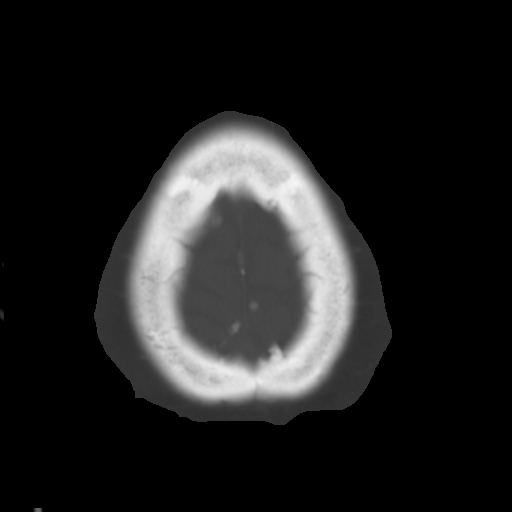
[im 29/32  brain]
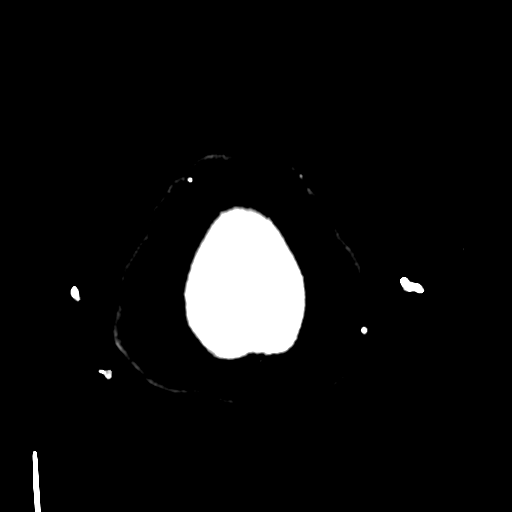

[Series 4: coronal soft · coronal · 0.33mm/px · 3 of 69 slices shown]
[im 23/69  brain]
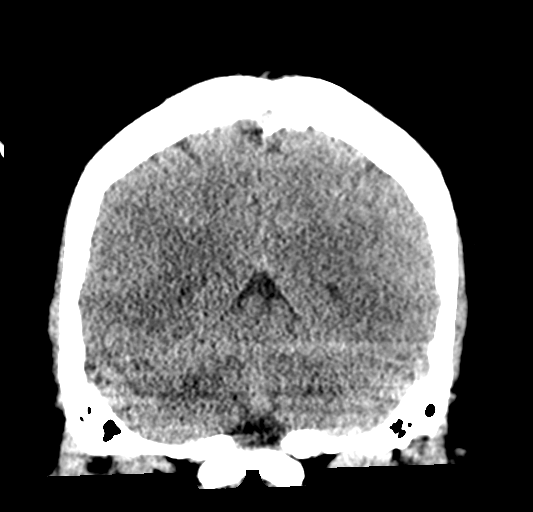
[im 31/69  brain]
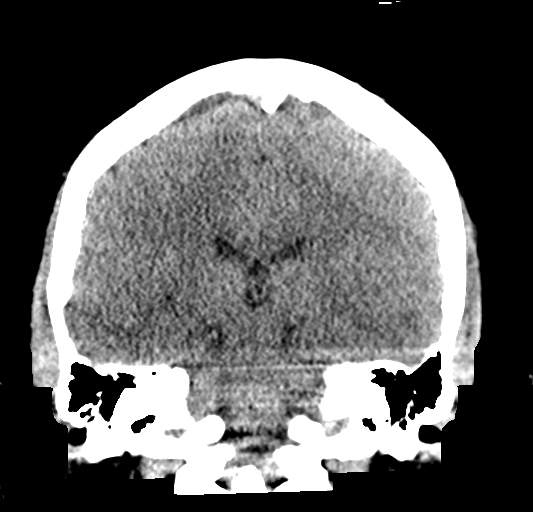
[im 38/69  brain]
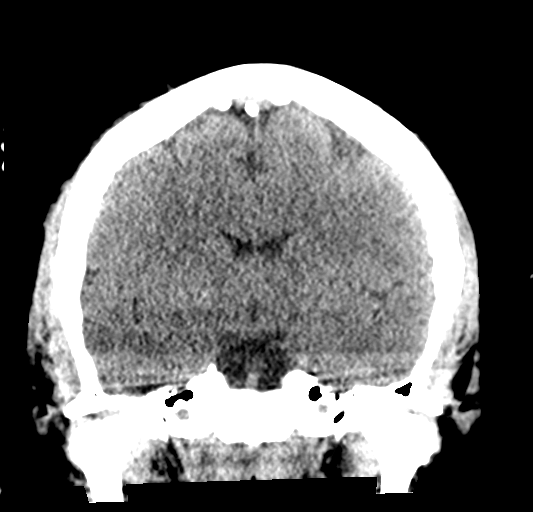

[Series 5: sagittal soft · sagittal · 0.35mm/px · 3 of 59 slices shown]
[im 20/59  brain]
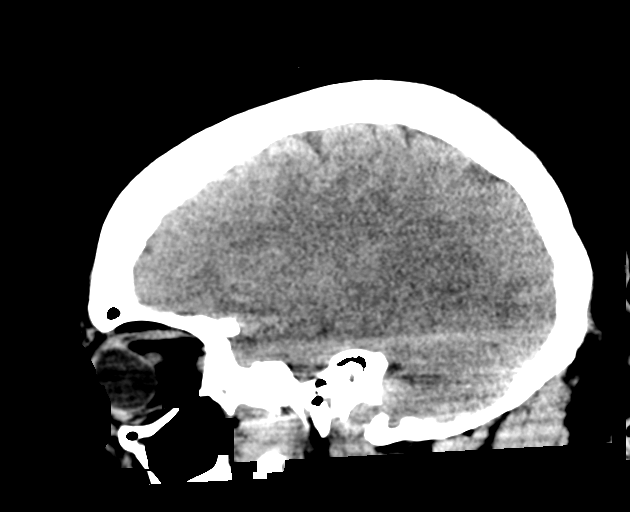
[im 30/59  brain]
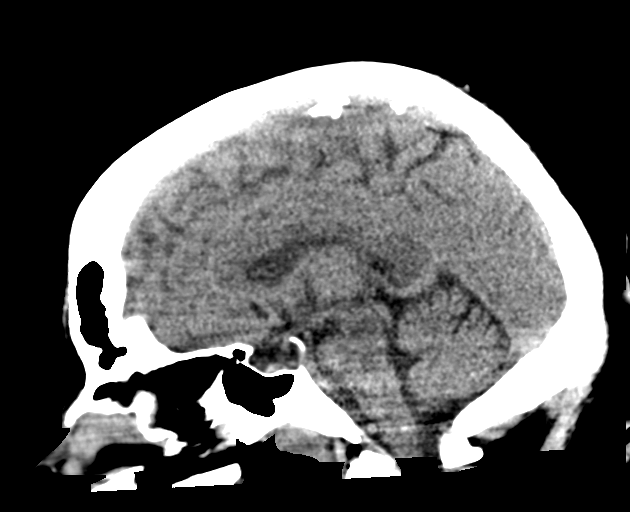
[im 39/59  brain]
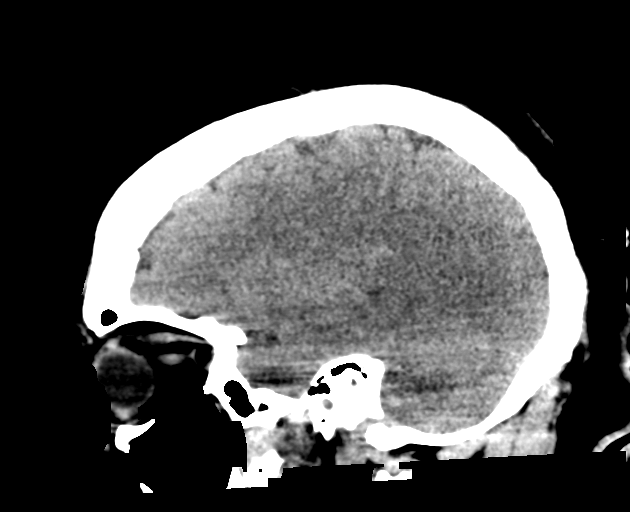

[16 of 47 positions shown; findings below may reference images not displayed]

FINDINGS: Brain: The brainstem, cerebellum, cerebral peduncles, thalami, basal
ganglia, basilar cisterns, and ventricular system appear within
normal limits. No intracranial hemorrhage, mass lesion, or acute
CVA.

Vascular: Unremarkable

Skull: Unremarkable

Sinuses/Orbits: Unremarkable

Other: No supplemental non-categorized findings.
IMPRESSION: 1. No significant abnormality identified.

## 2021-04-05 DIAGNOSIS — Z419 Encounter for procedure for purposes other than remedying health state, unspecified: Secondary | ICD-10-CM | POA: Diagnosis not present

## 2021-05-06 DIAGNOSIS — Z419 Encounter for procedure for purposes other than remedying health state, unspecified: Secondary | ICD-10-CM | POA: Diagnosis not present

## 2021-10-03 DIAGNOSIS — Z419 Encounter for procedure for purposes other than remedying health state, unspecified: Secondary | ICD-10-CM | POA: Diagnosis not present

## 2021-11-03 DIAGNOSIS — Z419 Encounter for procedure for purposes other than remedying health state, unspecified: Secondary | ICD-10-CM | POA: Diagnosis not present

## 2021-12-04 DIAGNOSIS — Z419 Encounter for procedure for purposes other than remedying health state, unspecified: Secondary | ICD-10-CM | POA: Diagnosis not present

## 2021-12-18 DIAGNOSIS — J019 Acute sinusitis, unspecified: Secondary | ICD-10-CM | POA: Diagnosis not present

## 2021-12-18 DIAGNOSIS — B9689 Other specified bacterial agents as the cause of diseases classified elsewhere: Secondary | ICD-10-CM | POA: Diagnosis not present

## 2021-12-18 DIAGNOSIS — Z299 Encounter for prophylactic measures, unspecified: Secondary | ICD-10-CM | POA: Diagnosis not present

## 2021-12-18 DIAGNOSIS — U071 COVID-19: Secondary | ICD-10-CM | POA: Diagnosis not present

## 2022-01-03 DIAGNOSIS — Z419 Encounter for procedure for purposes other than remedying health state, unspecified: Secondary | ICD-10-CM | POA: Diagnosis not present

## 2022-02-03 DIAGNOSIS — Z419 Encounter for procedure for purposes other than remedying health state, unspecified: Secondary | ICD-10-CM | POA: Diagnosis not present

## 2022-03-05 DIAGNOSIS — Z419 Encounter for procedure for purposes other than remedying health state, unspecified: Secondary | ICD-10-CM | POA: Diagnosis not present

## 2022-03-15 DIAGNOSIS — J029 Acute pharyngitis, unspecified: Secondary | ICD-10-CM | POA: Diagnosis not present

## 2022-03-15 DIAGNOSIS — J069 Acute upper respiratory infection, unspecified: Secondary | ICD-10-CM | POA: Diagnosis not present

## 2022-04-05 DIAGNOSIS — Z419 Encounter for procedure for purposes other than remedying health state, unspecified: Secondary | ICD-10-CM | POA: Diagnosis not present

## 2022-05-06 DIAGNOSIS — Z419 Encounter for procedure for purposes other than remedying health state, unspecified: Secondary | ICD-10-CM | POA: Diagnosis not present

## 2022-06-04 DIAGNOSIS — Z419 Encounter for procedure for purposes other than remedying health state, unspecified: Secondary | ICD-10-CM | POA: Diagnosis not present

## 2022-07-05 DIAGNOSIS — Z419 Encounter for procedure for purposes other than remedying health state, unspecified: Secondary | ICD-10-CM | POA: Diagnosis not present

## 2022-08-04 DIAGNOSIS — Z419 Encounter for procedure for purposes other than remedying health state, unspecified: Secondary | ICD-10-CM | POA: Diagnosis not present

## 2022-10-20 ENCOUNTER — Telehealth: Payer: Self-pay

## 2022-10-20 NOTE — Telephone Encounter (Signed)
LVM for patient to call back [phone], or to call PCP office to schedule follow up apt. AS, CMA  

## 2022-11-29 NOTE — Telephone Encounter (Signed)
Call patient in regards to birth control

## 2022-12-01 NOTE — Telephone Encounter (Signed)
Number not in service.
